# Patient Record
Sex: Female | Born: 1990 | Race: White | Hispanic: No | Marital: Single | Smoking: Never smoker
Health system: Southern US, Community
[De-identification: ages and names within clinical notes are randomized; demographics above are authoritative.]

## PROBLEM LIST (undated history)

## (undated) ENCOUNTER — Emergency Department (HOSPITAL_COMMUNITY): Admission: EM | Payer: Self-pay | Source: Home / Self Care

## (undated) DIAGNOSIS — N390 Urinary tract infection, site not specified: Secondary | ICD-10-CM

## (undated) DIAGNOSIS — E669 Obesity, unspecified: Secondary | ICD-10-CM

## (undated) DIAGNOSIS — I10 Essential (primary) hypertension: Secondary | ICD-10-CM

## (undated) DIAGNOSIS — T7840XA Allergy, unspecified, initial encounter: Secondary | ICD-10-CM

## (undated) HISTORY — DX: Urinary tract infection, site not specified: N39.0

## (undated) HISTORY — DX: Allergy, unspecified, initial encounter: T78.40XA

---

## 2014-12-08 ENCOUNTER — Encounter (HOSPITAL_COMMUNITY): Payer: Self-pay | Admitting: Adult Health

## 2014-12-08 ENCOUNTER — Emergency Department (HOSPITAL_COMMUNITY)
Admission: EM | Admit: 2014-12-08 | Discharge: 2014-12-08 | Disposition: A | Discharge status: Home / Self Care | Payer: Self-pay | Attending: Emergency Medicine | Admitting: Emergency Medicine

## 2014-12-08 DIAGNOSIS — N390 Urinary tract infection, site not specified: Secondary | ICD-10-CM | POA: Insufficient documentation

## 2014-12-08 DIAGNOSIS — I10 Essential (primary) hypertension: Secondary | ICD-10-CM | POA: Insufficient documentation

## 2014-12-08 HISTORY — DX: Essential (primary) hypertension: I10

## 2014-12-08 LAB — URINALYSIS, ROUTINE W REFLEX MICROSCOPIC
Bilirubin Urine: NEGATIVE
Glucose, UA: NEGATIVE mg/dL
KETONES UR: NEGATIVE mg/dL
Nitrite: NEGATIVE
PH: 6 (ref 5.0–8.0)
Protein, ur: NEGATIVE mg/dL
Specific Gravity, Urine: 1.017 (ref 1.005–1.030)
UROBILINOGEN UA: 0.2 mg/dL (ref 0.0–1.0)

## 2014-12-08 LAB — URINE MICROSCOPIC-ADD ON

## 2014-12-08 MED ORDER — ONDANSETRON HCL 4 MG PO TABS: 4.0000 mg | ORAL_TABLET | Freq: Four times a day (QID) | ORAL | Status: DC

## 2014-12-08 MED ORDER — CEPHALEXIN 250 MG PO CAPS
500.0000 mg | ORAL_CAPSULE | Freq: Once | ORAL | Status: AC
  Administered 2014-12-08: 500 mg via ORAL
  Filled 2014-12-08: qty 2

## 2014-12-08 MED ORDER — CIPROFLOXACIN HCL 500 MG PO TABS: 500.0000 mg | ORAL_TABLET | Freq: Two times a day (BID) | ORAL | Status: DC

## 2014-12-08 MED ORDER — TRAMADOL HCL 50 MG PO TABS: 50.0000 mg | ORAL_TABLET | Freq: Four times a day (QID) | ORAL | Status: DC | PRN

## 2014-12-08 MED ORDER — TRAMADOL HCL 50 MG PO TABS
100.0000 mg | ORAL_TABLET | Freq: Once | ORAL | Status: AC
  Administered 2014-12-08: 100 mg via ORAL
  Filled 2014-12-08: qty 2

## 2014-12-08 NOTE — ED Notes (Signed)
C/o painful urination, urgency and frequency and right flank pain-denies fever or chills. Began the 24th

## 2014-12-08 NOTE — Discharge Instructions (Signed)

## 2014-12-08 NOTE — ED Provider Notes (Signed)
CSN: [id number]     Arrival date & time 12/08/14  2052 History  This chart was scribed for Dorthula Matasiffany G Samiksha Pellicano, PA-C, working with Donnetta HutchingBrian Cook, MD by Elon SpannerGarrett Cook, ED Scribe. This patient was seen in room TR08C/TR08C and the patient's care was started at 9:50 PM.   Chief Complaint  Patient presents with  . Dysuria   The history is provided by the patient. No language interpreter was used.    HPI Comments: Kellie Foster is a 23 y.o. female who presents to the Emergency Department complaining of painful urination described as sharpness with associated increased frequency and mild lower back pain.  Patient reports a history of a UTI as a juvenile that was treated with Azo.  Patient denies nausea, vomiting, fevers, body aches, chills.  Patient is allergic to penicillin.     Past Medical History  Diagnosis Date  . Hypertension    History reviewed. No pertinent past surgical history. History reviewed. No pertinent family history. History  Substance Use Topics  . Smoking status: Never Smoker   . Smokeless tobacco: Not on file  . Alcohol Use: No   OB History    No data available     Review of Systems  Genitourinary: Positive for frequency.  Musculoskeletal: Positive for back pain.  All other systems reviewed and are negative.     Allergies  Review of patient's allergies indicates not on file.  Home Medications   Prior to Admission medications   Medication Sig Start Date End Date Taking? Authorizing Provider  ciprofloxacin (CIPRO) 500 MG tablet Take 1 tablet (500 mg total) by mouth 2 (two) times daily. 12/08/14   Haruto Demaria Irine SealG Valeri Sula, PA-C  ondansetron (ZOFRAN) 4 MG tablet Take 1 tablet (4 mg total) by mouth every 6 (six) hours. 12/08/14   Dorthula Matasiffany G Mccauley Diehl, PA-C  traMADol (ULTRAM) 50 MG tablet Take 1 tablet (50 mg total) by mouth every 6 (six) hours as needed. 12/08/14   Paiten Boies Irine SealG Catilyn Boggus, PA-C   BP 155/91 mmHg  Pulse 98  Temp(Src) 98.1 F (36.7 C) (Oral)  Resp 24  Ht 5\' 5"  (1.651 m)   Wt 258 lb (117.028 kg)  BMI 42.93 kg/m2  SpO2 100% Physical Exam  Constitutional: She is oriented to person, place, and time. She appears well-developed and well-nourished. No distress.  HENT:  Head: Normocephalic and atraumatic.  Eyes: Conjunctivae and EOM are normal.  Neck: Neck supple. No tracheal deviation present.  Cardiovascular: Normal rate.   Pulmonary/Chest: Effort normal. No respiratory distress.  Abdominal: There is tenderness in the suprapubic area. There is no rigidity, no rebound and no guarding.  Musculoskeletal: Normal range of motion.  Neurological: She is alert and oriented to person, place, and time.  Skin: Skin is warm and dry.  Psychiatric: She has a normal mood and affect. Her behavior is normal.  Nursing note and vitals reviewed.   ED Course  Procedures (including critical care time)  DIAGNOSTIC STUDIES: Oxygen Saturation is 100% on RA, normal by my interpretation.    COORDINATION OF CARE:  9:53 PM Patient will be prescribed antibiotics and pain medication.  Urine Culture sent out. Patient acknowledges and agrees with plan.     Labs Review Labs Reviewed  URINALYSIS, ROUTINE W REFLEX MICROSCOPIC - Abnormal; Notable for the following:    APPearance CLOUDY (*)    Hgb urine dipstick SMALL (*)    Leukocytes, UA LARGE (*)    All other components within normal limits  URINE MICROSCOPIC-ADD ON - Abnormal;  Notable for the following:    Squamous Epithelial / LPF FEW (*)    Bacteria, UA MANY (*)    All other components within normal limits  URINE CULTURE    Imaging Review No results found.   EKG Interpretation None      MDM   Final diagnoses:  UTI (lower urinary tract infection)   Rx: Cipro and Ultram  23 y.o.Kellie Foster's evaluation in the Emergency Department is complete. It has been determined that no acute conditions requiring further emergency intervention are present at this time. The patient/guardian have been advised of the diagnosis  and plan. We have discussed signs and symptoms that warrant return to the ED, such as changes or worsening in symptoms.  Vital signs are stable at discharge. Filed Vitals:   12/08/14 2105  BP: 155/91  Pulse: 98  Temp: 98.1 F (36.7 C)  Resp: 24    Patient/guardian has voiced understanding and agreed to follow-up with the PCP or specialist.  I personally performed the services described in this documentation, which was scribed in my presence. The recorded information has been reviewed and is accurate.    Dorthula Matasiffany G Aleathea Pugmire, PA-C 12/08/14 2204  Donnetta HutchingBrian Cook, MD 12/09/14 [phone]

## 2014-12-11 LAB — URINE CULTURE: Colony Count: >=100000

## 2014-12-12 ENCOUNTER — Telehealth (HOSPITAL_COMMUNITY): Payer: Self-pay | Admitting: *Deleted

## 2014-12-12 NOTE — Progress Notes (Signed)
ED Antimicrobial Stewardship Positive Culture Follow Up   Kellie Foster is an 24 y.o. female who presented to [facility] on 12/08/2014 with a chief complaint of  Chief Complaint  Patient presents with  . Dysuria    Recent Results (from the past 720 hour(s))  Urine culture     Status: None   Collection Time: 12/08/14  9:15 PM  Result Value Ref Range Status   Specimen Description URINE, RANDOM  Final   Special Requests ADDED [id number]  Final   Colony Count   Final    >=100,000 COLONIES/ML Performed at [facility]    Culture   Final    ESCHERICHIA COLI Note: Confirmed Extended Spectrum Beta-Lactamase Producer (ESBL) CRITICAL RESULT CALLED TO, READ BACK BY AND VERIFIED WITH: KIM ROBERTSON AT 9:28 P.M. ON 12/11/2014 WARBB Performed at [facility]    Report Status 12/11/2014 FINAL  Final   Organism ID, Bacteria ESCHERICHIA COLI  Final      Susceptibility   Escherichia coli - MIC*    AMPICILLIN >=32 RESISTANT Resistant     CEFAZOLIN >=64 RESISTANT Resistant     CEFTRIAXONE RESISTANT      CIPROFLOXACIN >=4 RESISTANT Resistant     GENTAMICIN <=1 SENSITIVE Sensitive     LEVOFLOXACIN >=8 RESISTANT Resistant     NITROFURANTOIN <=16 SENSITIVE Sensitive     TOBRAMYCIN <=1 SENSITIVE Sensitive     TRIMETH/SULFA >=320 RESISTANT Resistant     IMIPENEM <=0.25 SENSITIVE Sensitive     PIP/TAZO <=4 SENSITIVE Sensitive     * ESCHERICHIA COLI     Treated with ciprofloxacin, organism resistant to prescribed antimicrobial  Patient discharged originally without antimicrobial agent and treatment is now indicated  New antibiotic prescription:   1. Stop cipro 2. Start fosfomycin 3gm PO x 1  ED Provider: Emilia Beck  Marico Buckle, Drake Leach 12/12/2014, 12:39 PM Clinical Pharmacist Phone# [phone]

## 2014-12-13 ENCOUNTER — Telehealth: Payer: Self-pay | Admitting: Emergency Medicine

## 2014-12-15 ENCOUNTER — Telehealth (HOSPITAL_BASED_OUTPATIENT_CLINIC_OR_DEPARTMENT_OTHER): Payer: Self-pay | Admitting: *Deleted

## 2014-12-17 NOTE — Progress Notes (Signed)
Received call from patient regarding prescription for Fosfomycin that was called in after pos. Urine cultures for E- Coli ESBL. Patient states she is [facility] to [facility]  Patient is uninsured. Discussed MATCH program and guideline including $3 copay, patient agreeable. Enrolled in [facility]MATCH program and printed letter. At patient request MATCH [facility] [address]  [phone].  Faxed confirmation called.  Updated patient to contact [facility] when ready,she verbalized for understanding and appreciation for the assistance.

## 2014-12-18 ENCOUNTER — Other Ambulatory Visit: Payer: Self-pay | Admitting: *Deleted

## 2014-12-18 NOTE — Telephone Encounter (Signed)
Pt states she was prescribed a medication (monurol granules pack) that she could not fill due to it costing $300.  NCM noticed that pt received MATCH letter yesterday.  Called pharmacy to see if pt could still use that letter; pharmacy says ok.  [facility]

## 2015-01-29 ENCOUNTER — Emergency Department (HOSPITAL_COMMUNITY): Payer: Self-pay

## 2015-01-29 ENCOUNTER — Emergency Department (HOSPITAL_COMMUNITY)
Admission: EM | Admit: 2015-01-29 | Discharge: 2015-01-29 | Disposition: A | Discharge status: Home / Self Care | Payer: Self-pay | Attending: Emergency Medicine | Admitting: Emergency Medicine

## 2015-01-29 ENCOUNTER — Encounter (HOSPITAL_COMMUNITY): Payer: Self-pay | Admitting: Emergency Medicine

## 2015-01-29 DIAGNOSIS — Z880 Allergy status to penicillin: Secondary | ICD-10-CM | POA: Insufficient documentation

## 2015-01-29 DIAGNOSIS — I10 Essential (primary) hypertension: Secondary | ICD-10-CM | POA: Insufficient documentation

## 2015-01-29 DIAGNOSIS — R197 Diarrhea, unspecified: Secondary | ICD-10-CM | POA: Insufficient documentation

## 2015-01-29 DIAGNOSIS — R109 Unspecified abdominal pain: Secondary | ICD-10-CM

## 2015-01-29 DIAGNOSIS — M546 Pain in thoracic spine: Secondary | ICD-10-CM | POA: Insufficient documentation

## 2015-01-29 DIAGNOSIS — Z3202 Encounter for pregnancy test, result negative: Secondary | ICD-10-CM | POA: Insufficient documentation

## 2015-01-29 DIAGNOSIS — N201 Calculus of ureter: Secondary | ICD-10-CM | POA: Insufficient documentation

## 2015-01-29 DIAGNOSIS — R1110 Vomiting, unspecified: Secondary | ICD-10-CM

## 2015-01-29 DIAGNOSIS — Z79899 Other long term (current) drug therapy: Secondary | ICD-10-CM | POA: Insufficient documentation

## 2015-01-29 HISTORY — DX: Obesity, unspecified: E66.9

## 2015-01-29 LAB — CBC WITH DIFFERENTIAL/PLATELET
BASOS PCT: 0 % (ref 0–1)
Basophils Absolute: 0.1 10*3/uL (ref 0.0–0.1)
EOS PCT: 1 % (ref 0–5)
Eosinophils Absolute: 0.1 10*3/uL (ref 0.0–0.7)
HEMATOCRIT: 38.2 % (ref 36.0–46.0)
Hemoglobin: 12 g/dL (ref 12.0–15.0)
Lymphocytes Relative: 14 % (ref 12–46)
Lymphs Abs: 2 10*3/uL (ref 0.7–4.0)
MCH: 24.8 pg — AB (ref 26.0–34.0)
MCHC: 31.4 g/dL (ref 30.0–36.0)
MCV: 79.1 fL (ref 78.0–100.0)
MONO ABS: 1 10*3/uL (ref 0.1–1.0)
Monocytes Relative: 7 % (ref 3–12)
Neutro Abs: 11 10*3/uL — ABNORMAL HIGH (ref 1.7–7.7)
Neutrophils Relative %: 78 % — ABNORMAL HIGH (ref 43–77)
Platelets: 349 10*3/uL (ref 150–400)
RBC: 4.83 MIL/uL (ref 3.87–5.11)
RDW: 14.3 % (ref 11.5–15.5)
WBC: 14.2 10*3/uL — AB (ref 4.0–10.5)

## 2015-01-29 LAB — URINALYSIS, ROUTINE W REFLEX MICROSCOPIC
Glucose, UA: NEGATIVE mg/dL
Ketones, ur: 15 mg/dL — AB
NITRITE: NEGATIVE
Protein, ur: 100 mg/dL — AB
SPECIFIC GRAVITY, URINE: 1.031 — AB (ref 1.005–1.030)
Urobilinogen, UA: 1 mg/dL (ref 0.0–1.0)
pH: 5 (ref 5.0–8.0)

## 2015-01-29 LAB — COMPREHENSIVE METABOLIC PANEL
ALBUMIN: 3.5 g/dL (ref 3.5–5.2)
ALT: 23 U/L (ref 0–35)
ANION GAP: 6 (ref 5–15)
AST: 24 U/L (ref 0–37)
Alkaline Phosphatase: 60 U/L (ref 39–117)
BUN: 12 mg/dL (ref 6–23)
CHLORIDE: 108 mmol/L (ref 96–112)
CO2: 22 mmol/L (ref 19–32)
CREATININE: 0.71 mg/dL (ref 0.50–1.10)
Calcium: 8.8 mg/dL (ref 8.4–10.5)
GFR calc Af Amer: >90 mL/min (ref >90)
Glucose, Bld: 125 mg/dL — ABNORMAL HIGH (ref 70–99)
Potassium: 3.9 mmol/L (ref 3.5–5.1)
Sodium: 136 mmol/L (ref 135–145)
Total Bilirubin: 0.7 mg/dL (ref 0.3–1.2)
Total Protein: 7.4 g/dL (ref 6.0–8.3)

## 2015-01-29 LAB — POC URINE PREG, ED: Preg Test, Ur: NEGATIVE

## 2015-01-29 LAB — URINE MICROSCOPIC-ADD ON

## 2015-01-29 LAB — LIPASE, BLOOD: Lipase: 19 U/L (ref 11–59)

## 2015-01-29 MED ORDER — ONDANSETRON HCL 4 MG/2ML IJ SOLN
4.0000 mg | Freq: Once | INTRAMUSCULAR | Status: AC
  Administered 2015-01-29: 4 mg via INTRAVENOUS
  Filled 2015-01-29: qty 2

## 2015-01-29 MED ORDER — MORPHINE SULFATE 4 MG/ML IJ SOLN
4.0000 mg | Freq: Once | INTRAMUSCULAR | Status: AC
  Administered 2015-01-29: 4 mg via INTRAVENOUS
  Filled 2015-01-29: qty 1

## 2015-01-29 MED ORDER — NAPROXEN 500 MG PO TABS: 500.0000 mg | ORAL_TABLET | Freq: Two times a day (BID) | ORAL | Status: DC

## 2015-01-29 MED ORDER — ONDANSETRON HCL 4 MG PO TABS: 4.0000 mg | ORAL_TABLET | Freq: Four times a day (QID) | ORAL | Status: DC

## 2015-01-29 MED ORDER — KETOROLAC TROMETHAMINE 30 MG/ML IJ SOLN
30.0000 mg | Freq: Once | INTRAMUSCULAR | Status: AC
  Administered 2015-01-29: 30 mg via INTRAVENOUS
  Filled 2015-01-29: qty 1

## 2015-01-29 MED ORDER — OXYCODONE-ACETAMINOPHEN 5-325 MG PO TABS
1.0000 | ORAL_TABLET | Freq: Four times a day (QID) | ORAL | Status: DC | PRN
Start: 2015-01-29 — End: 2016-09-12

## 2015-01-29 MED ORDER — OXYCODONE-ACETAMINOPHEN 5-325 MG PO TABS: 1.0000 | ORAL_TABLET | Freq: Four times a day (QID) | ORAL | Status: DC | PRN

## 2015-01-29 MED ORDER — SODIUM CHLORIDE 0.9 % IV BOLUS (SEPSIS)
1000.0000 mL | Freq: Once | INTRAVENOUS | Status: AC
  Administered 2015-01-29: 1000 mL via INTRAVENOUS

## 2015-01-29 NOTE — ED Notes (Signed)
Patient transported to CT 

## 2015-01-29 NOTE — ED Notes (Signed)
[facility]

## 2015-01-29 NOTE — Discharge Instructions (Signed)

## 2015-01-29 NOTE — ED Notes (Signed)
Pt. reports  emesis , diarrhea and RLQ pain with chills, body aches and low grade fever onset today .

## 2015-01-29 NOTE — ED Provider Notes (Signed)
CSN: [id number]     Arrival date & time 01/29/15  1927 History   First MD Initiated Contact with Patient 01/29/15 2128     Chief Complaint  Patient presents with  . Emesis  . Diarrhea  . Abdominal Pain    (Consider location/radiation/quality/duration/timing/severity/associated sxs/prior Treatment) HPI Comments: 24 year old female with a history of hypertension and obesity presents to the emergency department for sudden onset of right sided abdominal pain. Patient also complaining of pain in her right mid back. Pain not relieved with ibuprofen. Patient reports associated nausea and 4 episodes of emesis. Patient states that pain is worse with certain movements and palpation to the area. She noticed hematuria when providing a urine sample in the ED. She reports a FHx of kidney stones; no personal hx of this.  Patient is a 24 y.o. female presenting with abdominal pain. The history is provided by the patient. No language interpreter was used.  Abdominal Pain Pain location: R sided. Pain quality: aching and sharp   Pain radiation: Radiates down to R groin. Pain severity:  Severe Onset quality:  Sudden Duration:  5 hours Timing:  Constant Progression:  Waxing and waning Chronicity:  New Context: awakening from sleep   Relieved by:  Nothing Worsened by:  Nothing tried Ineffective treatments:  NSAIDs Associated symptoms: hematuria, nausea and vomiting   Associated symptoms: no chest pain, no diarrhea, no dysuria, no fever, no hematemesis, no hematochezia, no melena, no shortness of breath, no vaginal bleeding and no vaginal discharge     Past Medical History  Diagnosis Date  . Hypertension   . Obesity    History reviewed. No pertinent past surgical history. No family history on file. History  Substance Use Topics  . Smoking status: Never Smoker   . Smokeless tobacco: Not on file  . Alcohol Use: No   OB History    No data available      Review of Systems  Constitutional:  Negative for fever.  Respiratory: Negative for shortness of breath.   Cardiovascular: Negative for chest pain.  Gastrointestinal: Positive for nausea, vomiting and abdominal pain. Negative for diarrhea, melena, hematochezia and hematemesis.  Genitourinary: Positive for hematuria and flank pain. Negative for dysuria, vaginal bleeding and vaginal discharge.  All other systems reviewed and are negative.   Allergies  Penicillins  Home Medications   Prior to Admission medications   Medication Sig Start Date End Date Taking? Authorizing Provider  metoprolol (LOPRESSOR) 50 MG tablet Take 50 mg by mouth 2 (two) times daily.   Yes Historical Provider, MD  ondansetron (ZOFRAN) 4 MG tablet Take 1 tablet (4 mg total) by mouth every 6 (six) hours. As needed for nausea/vomiting 01/29/15   Antony Madura, PA-C  oxyCODONE-acetaminophen (PERCOCET/ROXICET) 5-325 MG per tablet Take 1-2 tablets by mouth every 6 (six) hours as needed for moderate pain or severe pain. 01/29/15   Antony Madura, PA-C   BP 136/81 mmHg  Pulse 61  Temp(Src) 97.9 F (36.6 C) (Oral)  Resp 18  Ht  (1.676 m)  Wt 258 lb (117.028 kg)  BMI 41.66 kg/m2  SpO2 100%  LMP 01/07/2015   Physical Exam  Constitutional: She is oriented to person, place, and time. She appears well-developed and well-nourished. No distress.  Nontoxic/nonseptic appearing morbidly obese female  HENT:  Head: Normocephalic and atraumatic.  Eyes: Conjunctivae and EOM are normal. No scleral icterus.  Neck: Normal range of motion.  Cardiovascular: Normal rate, regular rhythm and normal heart sounds.   Pulmonary/Chest: Effort  normal and breath sounds normal. No respiratory distress. She has no wheezes. She has no rales.  Respirations even and unlabored  Abdominal: Soft. There is tenderness. There is no rebound and no guarding.  Tenderness to palpation in the right mid abdomen as well as the right flank. No masses or peritoneal signs. Abdomen soft and obese. Exam  is limited secondary to body habitus.  Musculoskeletal: Normal range of motion.  Neurological: She is alert and oriented to person, place, and time. She exhibits normal muscle tone. Coordination normal.  GCS 15. Patient moving extremities without ataxia.  Skin: Skin is warm and dry. No rash noted. She is not diaphoretic. No erythema. No pallor.  Psychiatric: She has a normal mood and affect. Her behavior is normal.  Nursing note and vitals reviewed.   ED Course  Procedures (including critical care time) Labs Review Labs Reviewed  CBC WITH DIFFERENTIAL/PLATELET - Abnormal; Notable for the following:    WBC 14.2 (*)    MCH 24.8 (*)    Neutrophils Relative % 78 (*)    Neutro Abs 11.0 (*)    All other components within normal limits  COMPREHENSIVE METABOLIC PANEL - Abnormal; Notable for the following:    Glucose, Bld 125 (*)    All other components within normal limits  URINALYSIS, ROUTINE W REFLEX MICROSCOPIC - Abnormal; Notable for the following:    Color, Urine AMBER (*)    APPearance CLOUDY (*)    Specific Gravity, Urine 1.031 (*)    Hgb urine dipstick LARGE (*)    Bilirubin Urine SMALL (*)    Ketones, ur 15 (*)    Protein, ur 100 (*)    Leukocytes, UA SMALL (*)    All other components within normal limits  URINE MICROSCOPIC-ADD ON - Abnormal; Notable for the following:    Squamous Epithelial / LPF FEW (*)    Bacteria, UA MANY (*)    All other components within normal limits  LIPASE, BLOOD  POC URINE PREG, ED    Imaging Review Ct Renal Stone Study  01/29/2015   CLINICAL DATA:  Right flank pain and hematuria with nausea and vomiting.  EXAM: CT ABDOMEN AND PELVIS WITHOUT CONTRAST  TECHNIQUE: Multidetector CT imaging of the abdomen and pelvis was performed following the standard protocol without IV contrast.  COMPARISON:  None.  FINDINGS: The lung bases are clear. Heart size is normal. No pneumothorax or pleural fluid.  A 1-2 mm stone is seen in the proximal right ureter just  below the ureteropelvic junction. There is mild to moderate right hydronephrosis. No other renal or ureteral stones are identified. The urinary bladder is decompressed but otherwise unremarkable. Uterus and adnexa appear normal.  There is fatty infiltration of the liver without focal lesion. The gallbladder, adrenal glands and spleen are unremarkable. There is some fatty atrophy about the stress of is some fatty atrophy of the head of the pancreas. No focal pancreatic lesion is identified. A few colonic diverticula are noted but there is no evidence of diverticulitis. The colon otherwise appears normal. The stomach, small bowel and appendix are unremarkable. There is no lymphadenopathy or fluid.  Bones demonstrate bilateral L5 pars interarticularis defects without anterolisthesis L5 on S1. No lytic or sclerotic bony lesion is identified.  IMPRESSION: Mild to moderate right hydronephrosis due to a 1-2 mm proximal right ureteral stone. No other urinary tract stones are identified.  Fatty infiltration of the liver.  Mild diverticulosis without diverticulitis.  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   Electronically Signed   By: Drusilla Kanner M.D.   On: 01/29/2015 22:56     EKG Interpretation None      MDM   Final diagnoses:  Flank pain  Vomiting  Ureterolithiasis    Pt has been diagnosed with a kidney stone via CT. There is no evidence of significant hydronephrosis, serum creatine WNL, vitals sign stable and the pt does not have irratractable vomiting. Pt will be discharged home with pain medications and has been advised to follow up with PCP. Urology referral and return precautions given. Patient agreeable to plan with no unaddressed concerns. Patient discharged in good condition.   Filed Vitals:   01/29/15 2231 01/29/15 2232 01/29/15 2300 01/29/15 2319  BP: 119/57  136/81   Pulse:  76 61   Temp:    97.9 F (36.6 C)  TempSrc:    Oral  Resp:       Height:      Weight:      SpO2:  100% 100%       Antony Madura, PA-C 01/29/15 2338  Flint Melter, MD 01/30/15 [phone]

## 2016-01-06 ENCOUNTER — Encounter (HOSPITAL_COMMUNITY): Payer: Self-pay | Admitting: Emergency Medicine

## 2016-01-06 ENCOUNTER — Emergency Department (HOSPITAL_COMMUNITY)
Admission: EM | Admit: 2016-01-06 | Discharge: 2016-01-06 | Disposition: A | Discharge status: Home / Self Care | Payer: Self-pay | Attending: Emergency Medicine | Admitting: Emergency Medicine

## 2016-01-06 DIAGNOSIS — M545 Low back pain: Secondary | ICD-10-CM | POA: Insufficient documentation

## 2016-01-06 DIAGNOSIS — R300 Dysuria: Secondary | ICD-10-CM | POA: Insufficient documentation

## 2016-01-06 DIAGNOSIS — Z79899 Other long term (current) drug therapy: Secondary | ICD-10-CM | POA: Insufficient documentation

## 2016-01-06 DIAGNOSIS — Z3202 Encounter for pregnancy test, result negative: Secondary | ICD-10-CM | POA: Insufficient documentation

## 2016-01-06 DIAGNOSIS — I10 Essential (primary) hypertension: Secondary | ICD-10-CM | POA: Insufficient documentation

## 2016-01-06 DIAGNOSIS — Z87442 Personal history of urinary calculi: Secondary | ICD-10-CM | POA: Insufficient documentation

## 2016-01-06 DIAGNOSIS — Z87440 Personal history of urinary (tract) infections: Secondary | ICD-10-CM | POA: Insufficient documentation

## 2016-01-06 DIAGNOSIS — R39198 Other difficulties with micturition: Secondary | ICD-10-CM | POA: Insufficient documentation

## 2016-01-06 DIAGNOSIS — R1030 Lower abdominal pain, unspecified: Secondary | ICD-10-CM | POA: Insufficient documentation

## 2016-01-06 DIAGNOSIS — Z880 Allergy status to penicillin: Secondary | ICD-10-CM | POA: Insufficient documentation

## 2016-01-06 LAB — URINALYSIS, ROUTINE W REFLEX MICROSCOPIC
Bilirubin Urine: NEGATIVE
Glucose, UA: NEGATIVE mg/dL
HGB URINE DIPSTICK: NEGATIVE
Ketones, ur: NEGATIVE mg/dL
Leukocytes, UA: NEGATIVE
Nitrite: NEGATIVE
Protein, ur: NEGATIVE mg/dL
SPECIFIC GRAVITY, URINE: 1.01 (ref 1.005–1.030)
pH: 6.5 (ref 5.0–8.0)

## 2016-01-06 LAB — BASIC METABOLIC PANEL
ANION GAP: 13 (ref 5–15)
BUN: 7 mg/dL (ref 6–20)
CO2: 22 mmol/L (ref 22–32)
Calcium: 9.4 mg/dL (ref 8.9–10.3)
Chloride: 103 mmol/L (ref 101–111)
Creatinine, Ser: 0.61 mg/dL (ref 0.44–1.00)
GFR calc non Af Amer: >60 mL/min (ref >60)
Glucose, Bld: 98 mg/dL (ref 65–99)
Potassium: 3.9 mmol/L (ref 3.5–5.1)
Sodium: 138 mmol/L (ref 135–145)

## 2016-01-06 LAB — I-STAT BETA HCG BLOOD, ED (MC, WL, AP ONLY)

## 2016-01-06 NOTE — ED Notes (Signed)
Pt from home for eval of back pain, states was dx with UTI and kidney infection and started on cipro. However pt states "my kidneys still hurt." denies any abd pain, vaginal discharge. States has had decrease in appetite and urine production.

## 2016-01-06 NOTE — ED Provider Notes (Signed)
CSN: [id number]     Arrival date & time 01/06/16  1439 History   First MD Initiated Contact with Patient 01/06/16 1744     Chief Complaint  Patient presents with  . Urinary Tract Infection     (Consider location/radiation/quality/duration/timing/severity/associated sxs/prior Treatment) HPI  25 year old obese female with history of hypertension presents complaining of flank pain, and dysuria.  Pt report for the past 5 days she has had burning on urination, difficulty voiding. Report bilateral low back pain and suprapubic pain instead of CVA tenderness.  Pain is sharp, stabbing.   Denies vaginal discharge.  Is sexually active but denies any pain with sex.  LMP 5 days ago.  Never been tested for STD.  No trauma or heavy lifting.  Pt was seen 3 days ago at PCP office for her complaints, and was prescribed Cipro as treatment for urinary tract infection based on the urine result.  She is 3 days into her treatment, but states the symptom has not fully resolved. She did notice that her burning urination and abdominal pain has improved. She did have remote history of kidney stones. She denies having fever, chills, nausea vomiting diarrhea, chest pain, shortness of breath, vaginal bleeding or vaginal discharge. She is sexually active with one partner who is in the room.   Past Medical History  Diagnosis Date  . Hypertension   . Obesity    History reviewed. No pertinent past surgical history. No family history on file. Social History  Substance Use Topics  . Smoking status: Never Smoker   . Smokeless tobacco: None  . Alcohol Use: No   OB History    No data available     Review of Systems  All other systems reviewed and are negative.     Allergies  Penicillins and Sulfa antibiotics  Home Medications   Prior to Admission medications   Medication Sig Start Date End Date Taking? Authorizing Provider  metoprolol (LOPRESSOR) 50 MG tablet Take 50 mg by mouth 2 (two) times daily.     Historical Provider, MD  ondansetron (ZOFRAN) 4 MG tablet Take 1 tablet (4 mg total) by mouth every 6 (six) hours. As needed for nausea/vomiting 01/29/15   Antony Madura, PA-C  oxyCODONE-acetaminophen (PERCOCET/ROXICET) 5-325 MG per tablet Take 1-2 tablets by mouth every 6 (six) hours as needed for moderate pain or severe pain. 01/29/15   Antony Madura, PA-C   BP 170/90 mmHg  Pulse 101  Temp(Src) 98.6 F (37 C)  Resp 20  SpO2 100% Physical Exam  Constitutional: She appears well-developed and well-nourished. No distress.  Morbidly obese Caucasian female laying in bed in no acute discomfort.  HENT:  Head: Atraumatic.  Eyes: Conjunctivae are normal.  Neck: Neck supple.  Cardiovascular: Normal rate and regular rhythm.   Pulmonary/Chest: Effort normal and breath sounds normal.  Abdominal: Soft. Bowel sounds are normal. She exhibits no distension. There is tenderness (mild suprapubic tenderness without guarding or rebound tenderness. No CVA tenderness.).  Musculoskeletal: She exhibits tenderness (Mild paralumbar spinal muscle tenderness on percussion bilaterally without CVA tenderness.).  Neurological: She is alert.  Skin: No rash noted.  Psychiatric: She has a normal mood and affect.  Nursing note and vitals reviewed.   ED Course  Procedures (including critical care time) Labs Review Labs Reviewed  URINALYSIS, ROUTINE W REFLEX MICROSCOPIC (NOT AT [facility])  BASIC METABOLIC PANEL  I-STAT BETA HCG BLOOD, ED ([facility], [facility], [facility] ONLY)    Imaging Review No results found. I have personally reviewed and evaluated  these images and lab results as part of my medical decision-making.   EKG Interpretation None      MDM   Final diagnoses:  Dysuria    BP 143/76 mmHg  Pulse 81  Temp(Src) 98.6 F (37 C)  Resp 20  SpO2 97%   6:43 PM patient presents with improving symptoms of UTI although not fully resolved. She is currently on Cipro antibiotic. Her urine today shows no evidence of urinary tract  infection. Her renal function is normal. Her pregnancy test is negative. I did discuss other potential cause of low abdominal pain and low back pain such as STD and offer a pelvic examination however patient felt that she has low risk for STD and prefers to continue with antibiotic and follow-up with her PCP. At this time I have low suspicion for any acute emergent medical condition such as appendicitis, diverticulitis, ovarian torsion, tubo-ovarian abscess or ectopic pregnancy although my examination is limited due to patient refusal for pelvic examination. Return precaution discussed. Patient voices pain and agrees with plan. No suspicion for kidney stones since patient has bilateral back pain and no obvious flank pain.   Fayrene Helper, PA-C 01/06/16 1847  Linwood Dibbles, MD 01/07/16 [phone]

## 2016-01-06 NOTE — Discharge Instructions (Signed)
Please continue taking your antibiotic for the full duration as treatment for your urinary tract infection. Follow with Dr. for further evaluation. Return to the ED if your condition worsen or if you have any other concern.  Dysuria Dysuria is pain or discomfort while urinating. The pain or discomfort may be felt in the tube that carries urine out of the bladder (urethra) or in the surrounding tissue of the genitals. The pain may also be felt in the groin area, lower abdomen, and lower back. You may have to urinate frequently or have the sudden feeling that you have to urinate (urgency). Dysuria can affect both men and women, but is more common in women. Dysuria can be caused by many different things, including:  Urinary tract infection in women.  Infection of the kidney or bladder.  Kidney stones or bladder stones.  Certain sexually transmitted infections (STIs), such as chlamydia.  Dehydration.  Inflammation of the vagina.  Use of certain medicines.  Use of certain soaps or scented products that cause irritation. HOME CARE INSTRUCTIONS Watch your dysuria for any changes. The following actions may help to reduce any discomfort you are feeling:  Drink enough fluid to keep your urine clear or pale yellow.  Empty your bladder often. Avoid holding urine for long periods of time.  After a bowel movement or urination, women should cleanse from front to back, using each tissue only once.  Empty your bladder after sexual intercourse.  Take medicines only as directed by your health care provider.  If you were prescribed an antibiotic medicine, finish it all even if you start to feel better.  Avoid caffeine, tea, and alcohol. They can irritate the bladder and make dysuria worse. In men, alcohol may irritate the prostate.  Keep all follow-up visits as directed by your health care provider. This is important.  If you had any tests done to find the cause of dysuria, it is your  responsibility to obtain your test results. Ask the lab or department performing the test when and how you will get your results. Talk with your health care provider if you have any questions about your results. SEEK MEDICAL CARE IF:  You develop pain in your back or sides.  You have a fever.  You have nausea or vomiting.  You have blood in your urine.  You are not urinating as often as you usually do. SEEK IMMEDIATE MEDICAL CARE IF:  You pain is severe and not relieved with medicines.  You are unable to hold down any fluids.  You or someone else notices a change in your mental function.  You have a rapid heartbeat at rest.  You have shaking or chills.  You feel extremely weak.   This information is not intended to replace advice given to you by your health care provider. Make sure you discuss any questions you have with your health care provider.   Document Released: 08/26/2004 Document Revised: 12/19/2014 Document Reviewed: 07/24/2014 Elsevier Interactive Patient Education [employer]

## 2016-01-18 IMAGING — CT CT RENAL STONE PROTOCOL
2 of 4 series · 15 of 46 positions shown, 17 images · non-contrast
Comparison: None.

CLINICAL DATA: Right flank pain and hematuria with nausea and
vomiting.

EXAM:
CT ABDOMEN AND PELVIS WITHOUT CONTRAST
TECHNIQUE: Multidetector CT imaging of the abdomen and pelvis was performed
following the standard protocol without IV contrast.

[Series 2: stone study 5.0 i30f 1 · axial · 0.98mm/px · z∈[-502,-2]mm · 12 of 110 slices shown, 14 images]
[im 5/110  soft-tissue]
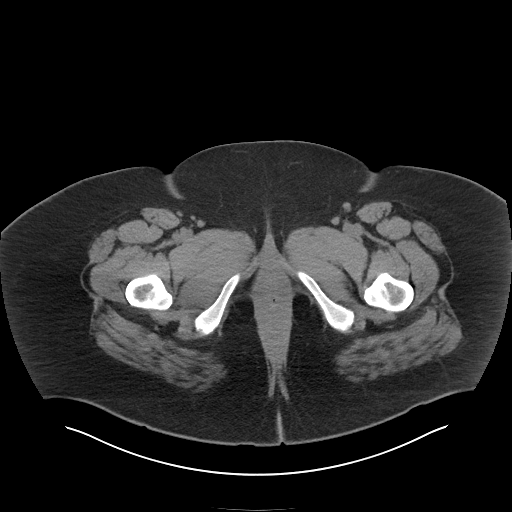
[im 5/110  bone]
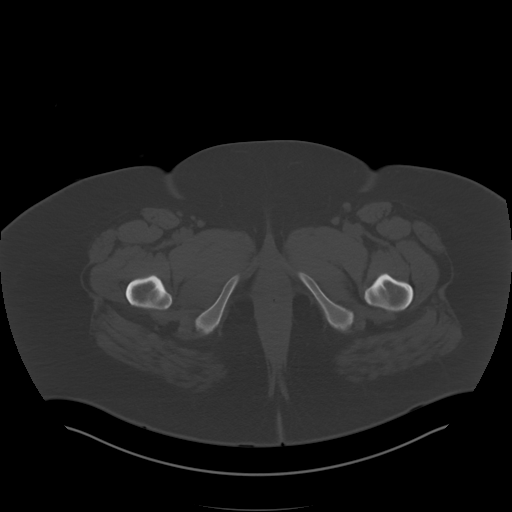
[im 15/110  soft-tissue]
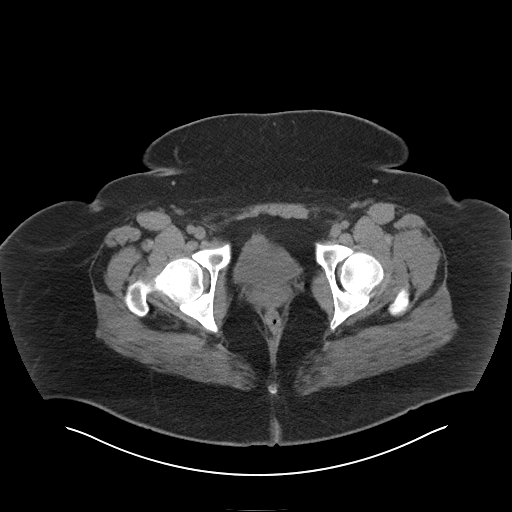
[im 25/110  soft-tissue]
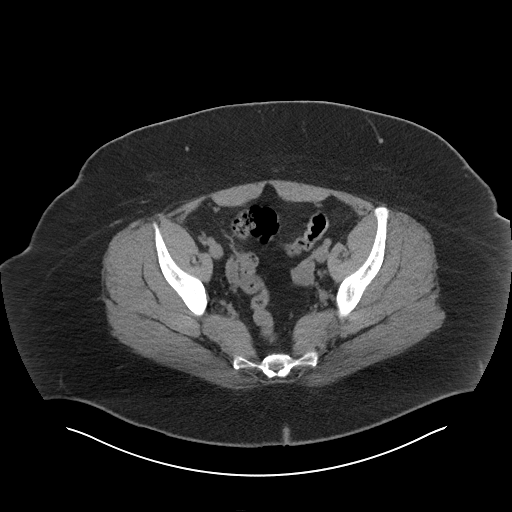
[im 35/110  soft-tissue]
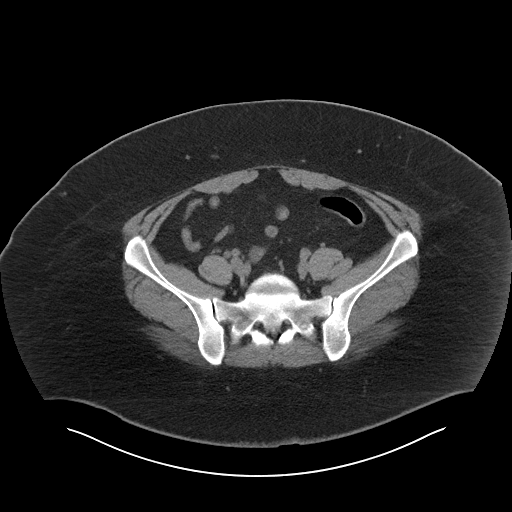
[im 40/110  soft-tissue]
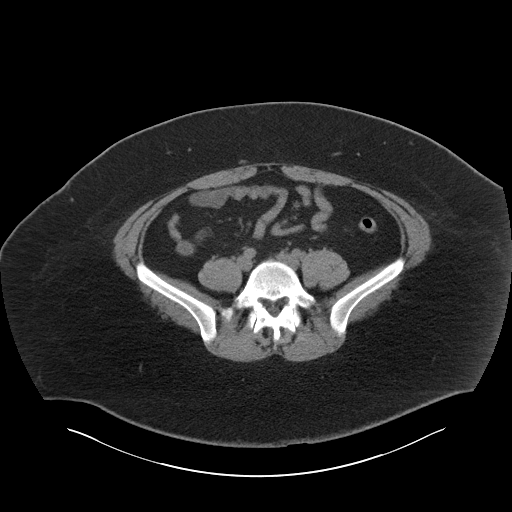
[im 50/110  soft-tissue]
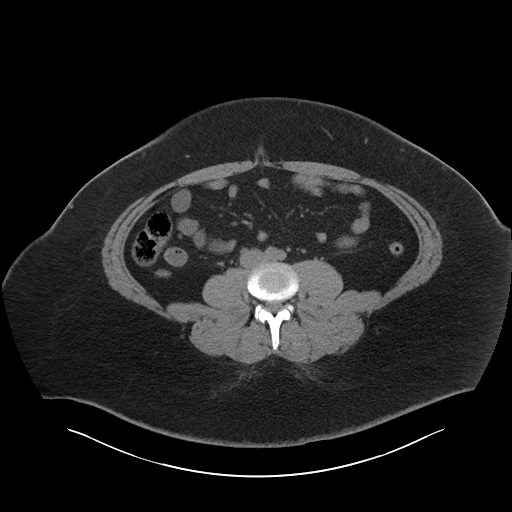
[im 60/110  soft-tissue]
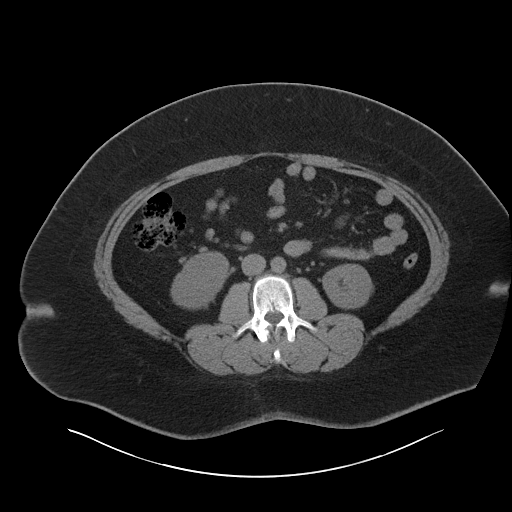
[im 70/110  soft-tissue]
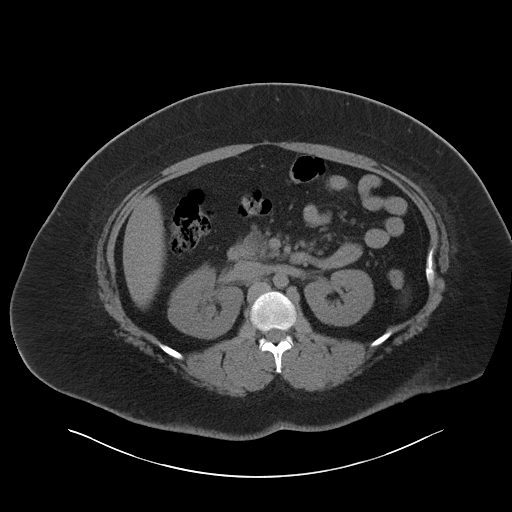
[im 75/110  soft-tissue]
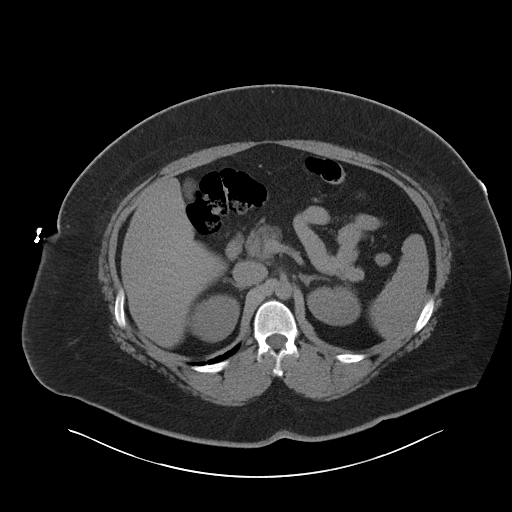
[im 75/110  bone]
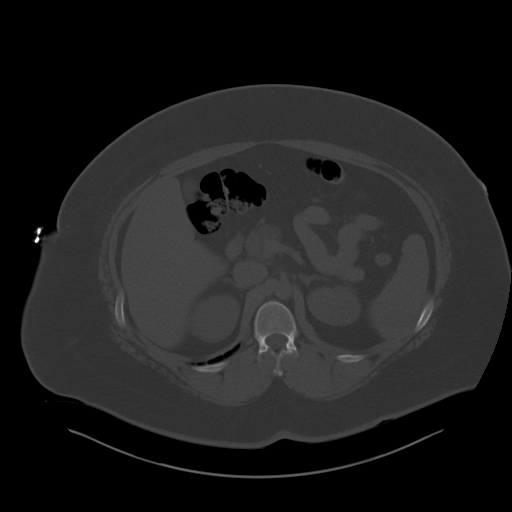
[im 85/110  soft-tissue]
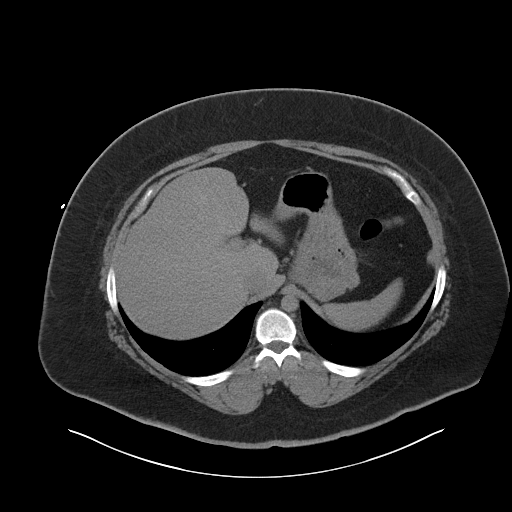
[im 95/110  soft-tissue]
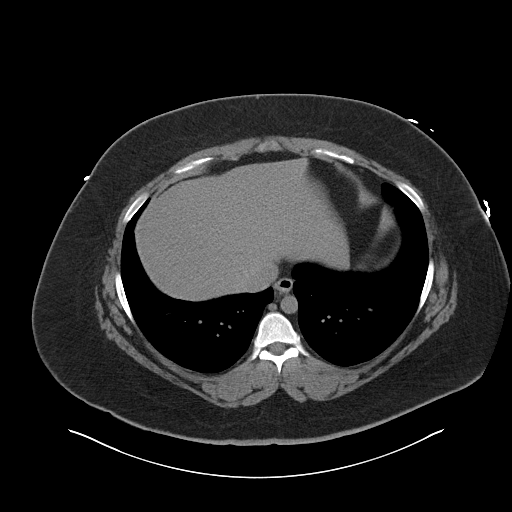
[im 105/110  soft-tissue]
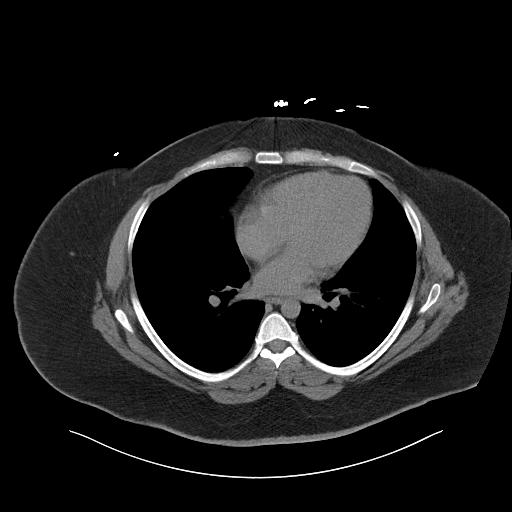

[Series 5: coronal soft tissue · coronal · 1.07mm/px · 3 of 123 slices shown]
[im 41/123  soft-tissue]
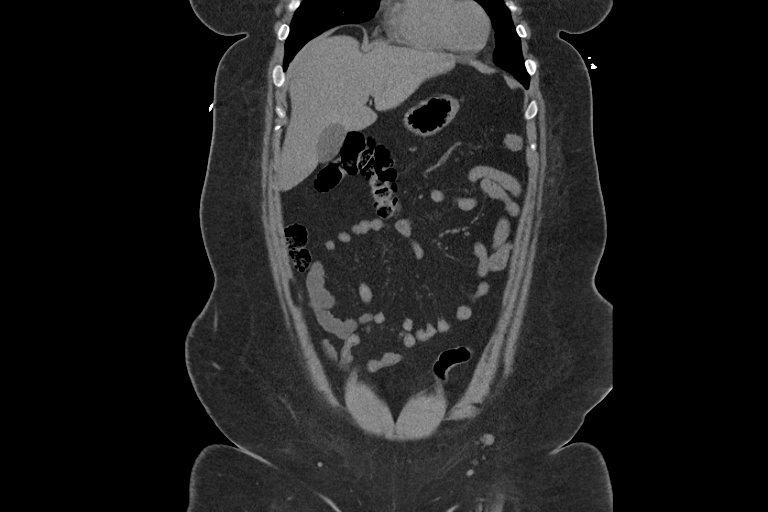
[im 55/123  soft-tissue]
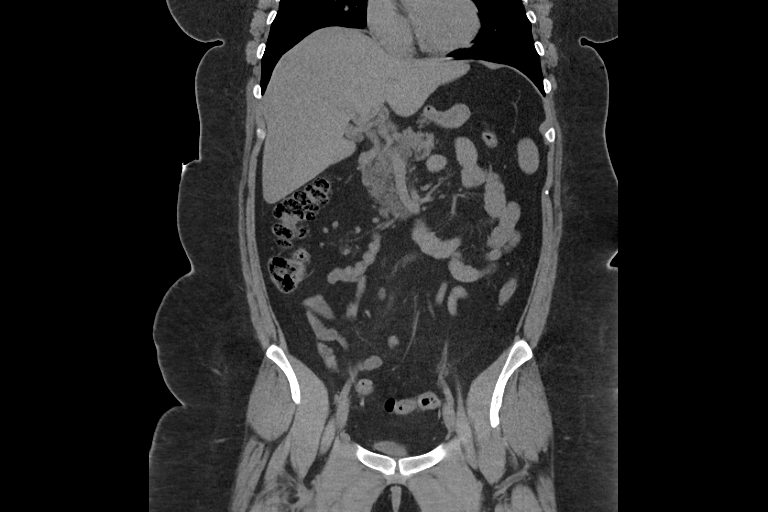
[im 68/123  soft-tissue]
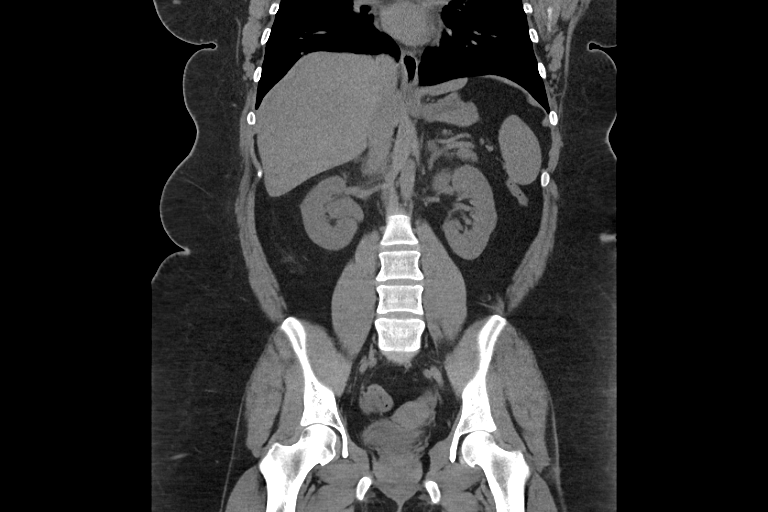

[15 of 46 positions shown; findings below may reference images not displayed]

FINDINGS: The lung bases are clear. Heart size is normal. No pneumothorax or
pleural fluid.

A 1-2 mm stone is seen in the proximal right ureter just below the
ureteropelvic junction. There is mild to moderate right
hydronephrosis. No other renal or ureteral stones are identified.
The urinary bladder is decompressed but otherwise unremarkable.
Uterus and adnexa appear normal.

There is fatty infiltration of the liver without focal lesion. The
gallbladder, adrenal glands and spleen are unremarkable. There is
some fatty atrophy about the stress of is some fatty atrophy of the
head of the pancreas. No focal pancreatic lesion is identified. A
few colonic diverticula are noted but there is no evidence of
diverticulitis. The colon otherwise appears normal. The stomach,
small bowel and appendix are unremarkable. There is no
lymphadenopathy or fluid.

Bones demonstrate bilateral L5 pars interarticularis defects without
anterolisthesis L5 on S1. No lytic or sclerotic bony lesion is
identified.
IMPRESSION: Mild to moderate right hydronephrosis due to a 1-2 mm proximal right
ureteral stone. No other urinary tract stones are identified.

Fatty infiltration of the liver.

Mild diverticulosis without diverticulitis.

Bilateral L5 pars interarticularis defects without anterolisthesis
L5 on S1.

## 2016-08-23 ENCOUNTER — Ambulatory Visit: Payer: Self-pay | Admitting: Family Medicine

## 2016-09-12 ENCOUNTER — Encounter: Payer: Self-pay | Admitting: Family Medicine

## 2016-09-12 ENCOUNTER — Ambulatory Visit (INDEPENDENT_AMBULATORY_CARE_PROVIDER_SITE_OTHER): Payer: Self-pay | Admitting: Family Medicine

## 2016-09-12 VITALS — BP 135/81 | HR 79 | Temp 98.3°F | Resp 16 | Ht 64.0 in | Wt 324.8 lb

## 2016-09-12 DIAGNOSIS — Z87898 Personal history of other specified conditions: Secondary | ICD-10-CM

## 2016-09-12 DIAGNOSIS — Z6841 Body Mass Index (BMI) 40.0 and over, adult: Secondary | ICD-10-CM | POA: Insufficient documentation

## 2016-09-12 DIAGNOSIS — Z23 Encounter for immunization: Secondary | ICD-10-CM

## 2016-09-12 DIAGNOSIS — Z7722 Contact with and (suspected) exposure to environmental tobacco smoke (acute) (chronic): Secondary | ICD-10-CM

## 2016-09-12 DIAGNOSIS — N390 Urinary tract infection, site not specified: Secondary | ICD-10-CM | POA: Insufficient documentation

## 2016-09-12 DIAGNOSIS — N920 Excessive and frequent menstruation with regular cycle: Secondary | ICD-10-CM

## 2016-09-12 DIAGNOSIS — R7309 Other abnormal glucose: Secondary | ICD-10-CM

## 2016-09-12 DIAGNOSIS — Z114 Encounter for screening for human immunodeficiency virus [HIV]: Secondary | ICD-10-CM

## 2016-09-12 DIAGNOSIS — Z131 Encounter for screening for diabetes mellitus: Secondary | ICD-10-CM

## 2016-09-12 DIAGNOSIS — I10 Essential (primary) hypertension: Secondary | ICD-10-CM

## 2016-09-12 MED ORDER — LISINOPRIL 20 MG PO TABS: 20.0000 mg | ORAL_TABLET | Freq: Every day | ORAL | 3 refills | Status: DC

## 2016-09-12 MED ORDER — HYDROCHLOROTHIAZIDE 25 MG PO TABS: 25.0000 mg | ORAL_TABLET | Freq: Every day | ORAL | 3 refills | Status: DC

## 2016-09-12 NOTE — Assessment & Plan Note (Addendum)
Chronic problem, well controlled  Plan: 1. Switch Lisinopril from 10mg  x 3 (30mg  daily) to 20mg  daily 2. DC metoprolol (given inc risk for metabolic effects and current HR stable, also no other cardiac indication without prior MI or arrhythmia) 3. Start HCTZ 25mg  daily - also help chronic peripheral edema likely related to weight 4. Lifestyle recommendations, weight loss, avoid 2nd hand smoke 5. Follow-up 6 weeks BP

## 2016-09-12 NOTE — Assessment & Plan Note (Signed)
Stable controlled on OCPs Follow-up as needed for refills

## 2016-09-12 NOTE — Progress Notes (Signed)
Subjective:    Patient ID: Kellie Foster, female    DOB: [birth year], 25 y.o.   MRN: [id number]  Kellie Foster is a 25 y.o. female presenting on 09/12/2016 for Establish Care  Previously established with PCP in [place].  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 Currently asymptomatic and doing well. She states her prior PCP was considering giving her rx antibiotics to take when needed, but never did this. Previously has taken Keflex for UTI. See listed allergies (PCN, sulfa, Cipro). Her typical UTI symptoms are low back pain, dysuria, and fever. Improved with keflex and zofran often. Patient's last menstrual period was 08/26/2016.  CHRONIC HTN: Reports needs BP med refills today. Does not check BP regularly but does have wrist cuff at home to check. Current Meds - Lisinopril 10mg  (taking 30mg  daily), Metoprolol 50mg  BID (states this was started due to low cost, but no other indication, no prior MI or arrhythmia or known heart disease) Reports good compliance, took meds today. Tolerating well, w/o complaints. Denies CP, dyspnea, HA, edema, dizziness / lightheadedness  MORBID OBESITY BMI >55 / Screening Cholesterol / History of Abnormal Glucose - Admits to chronic problem with weight gain, more recently over past 1 year when started OCPs has had significant weight gain, unable to lose significant amount. No medical treatment or specific diet plan for weight loss. - Her husband is diabetic, and she has occasionally checked her own fastin sugars in AM around 70-90s (never >100), otherwise no known history of elevated sugar, prior A1c but does not recall. 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 Hypertension   . Obesity   . Recurrent UTI (urinary tract infection)    Social History   Social History  . Marital status: Single    Spouse name: N/A  . Number of children: N/A  . Years of education: N/A   Occupational History  . Not on file.   Social History Main Topics  . Smoking status: Never Smoker  . Smokeless tobacco: Never Used     Comment: 2nd hand smoking exposure  . Alcohol use No  . Drug use: No  . Sexual activity: Not on file   Other Topics Concern  . Not on file   Social History Narrative  . No narrative on file   Family History  Problem Relation Age of Onset  . Hypertension Mother   . Diabetes Mother   . Kidney cancer Mother   . Hypertension Father    No current outpatient prescriptions on file prior to  visit.   No current facility-administered medications on file prior to visit.     Review of Systems  Constitutional: Negative for activity change, appetite change, chills, diaphoresis, fatigue, fever and unexpected weight change.  HENT: Negative for congestion, hearing loss and sinus pressure.   Eyes: Negative for visual disturbance.  Respiratory: Negative for cough, chest tightness, shortness of breath and wheezing.   Cardiovascular: Negative for chest pain, palpitations and leg swelling.  Gastrointestinal: Negative for abdominal  distention, abdominal pain, blood in stool, constipation, diarrhea, nausea and vomiting.  Endocrine: Negative for cold intolerance, polydipsia and polyuria.  Genitourinary: Negative for difficulty urinating, dysuria, flank pain, frequency, hematuria, menstrual problem and urgency.  Musculoskeletal: Negative for arthralgias, back pain, joint swelling and neck pain.  Skin: Negative for rash.  Allergic/Immunologic: Negative for environmental allergies.  Neurological: Negative for dizziness, weakness, light-headedness, numbness and headaches.  Hematological: Negative for adenopathy.  Psychiatric/Behavioral: Negative for behavioral problems, confusion, decreased concentration, dysphoric mood and sleep disturbance. The patient is not nervous/anxious.    Per HPI unless specifically indicated above     Objective:    BP 135/81   Pulse 79   Temp 98.3 F (36.8 C) (Oral)   Resp 16   Ht 5\' 4"  (1.626 m)   Wt (!) 324 lb 12.8 oz (147.3 kg)   LMP 08/26/2016   BMI 55.75 kg/m   Wt Readings from Last 3 Encounters:  09/12/16 (!) 324 lb 12.8 oz (147.3 kg)  01/29/15 258 lb (117 kg)  12/08/14 258 lb (117 kg)    Physical Exam  Constitutional: She is oriented to person, place, and time. She appears well-developed and well-nourished. No distress.  Obese, Well-appearing, comfortable, cooperative   HENT:  Head: Normocephalic and atraumatic.  Mouth/Throat: Oropharynx is clear and moist.  Eyes: Conjunctivae and EOM are normal. Pupils are equal, round, and reactive to light.  Neck: Normal range of motion. Neck supple. No thyromegaly present.  Cardiovascular: Normal rate, regular rhythm, normal heart sounds and intact distal pulses.   No murmur heard. Pulmonary/Chest: Effort normal and breath sounds normal. No respiratory distress. She has no wheezes. She has no rales.  Abdominal: Soft. Bowel sounds are normal. She exhibits no distension and no mass. There is no tenderness.  Musculoskeletal: Normal range  of motion. She exhibits no edema or tenderness.  Back normal without deformity or abnormal curvature.  Upper / Lower Extremities: - Normal muscle tone, strength bilateral upper extremities 5/5, lower extremities 5/5 - Bilateral shoulders, knees, wrist, ankles without deformity, tenderness, effusion - Normal Gait  Lymphadenopathy:    She has no cervical adenopathy.  Neurological: She is alert and oriented to person, place, and time.  Distal sensation intact to light touch  Skin: Skin is warm and dry. No rash noted. She is not diaphoretic.  Psychiatric: She has a normal mood and affect. Her behavior is normal. Thought content normal.  Nursing note and vitals reviewed.  I have personally reviewed the following lab results from 01/06/16.  Results for orders placed or performed during the hospital encounter of 01/06/16  Urinalysis, Routine w reflex microscopic-may I&O cath if menses (not at [facility])  Result Value Ref Range   Color, Urine YELLOW YELLOW   APPearance CLEAR CLEAR   Specific Gravity, Urine 1.010 1.005 - 1.030   pH 6.5 5.0 - 8.0   Glucose, UA NEGATIVE NEGATIVE mg/dL   Hgb urine dipstick NEGATIVE NEGATIVE   Bilirubin Urine NEGATIVE NEGATIVE   Ketones, ur NEGATIVE NEGATIVE mg/dL   Protein,  ur NEGATIVE NEGATIVE mg/dL   Nitrite NEGATIVE NEGATIVE   Leukocytes, UA NEGATIVE NEGATIVE  Basic metabolic panel  Result Value Ref Range   Sodium 138 135 - 145 mmol/L   Potassium 3.9 3.5 - 5.1 mmol/L   Chloride 103 101 - 111 mmol/L   CO2 22 22 - 32 mmol/L   Glucose, Bld 98 65 - 99 mg/dL   BUN 7 6 - 20 mg/dL   Creatinine, Ser 1.610.61 0.44 - 1.00 mg/dL   Calcium 9.4 8.9 - 09.610.3 mg/dL   GFR calc non Af Amer >60 >60 mL/min   GFR calc Af Amer >60 >60 mL/min   Anion gap 13 5 - 15  I-Stat beta hCG blood, ED ([facility], [facility], [facility] only)  Result Value Ref Range   I-stat hCG, quantitative <5.0 <5 mIU/mL   Comment 3              Assessment & Plan:   Problem List Items Addressed This Visit    Second hand  smoke exposure    Never smoker, advised her to continue to discuss smoking cessation with family members.      Recurrent UTI (urinary tract infection)    Currently asymptomatic without UTI. Chronic problem worsening >2 years, seems related to menstrual cycle, 3-5 UTi a year. No significant complicated UTIs or pyelo recently.  Plan: 1. Defer antibiotic prophylaxis (especially given very limited options with multiple antibiotic allergies) 2. [facility] for now to establish and recommendations (may need further work-up prior to starting prophylaxis, and consider which antibiotic would be best option, would consider Macrobid given no known allergy, but will await further recommendations) 3. If develops UTI in meantime, would consider Keflex course      Relevant Orders   Ambulatory referral to Urology   Morbid obesity with BMI of 50.0-59.9, adult (HCC) - Primary    Concern with significant weight gain over past 1-2 years, possibly related in part to OCP, however multifactorial, currently limited diet / exercise regimen, limited by work with getting home late. - Check fasting lipid today, A1c for DM screening, TSH (prior normal, but no records) - Counseling briefly on lifestyle modifications, low carb / smaller portions, continue drinking mostly water - Future follow-up with [facility] if difficulty losing weight, consider wt loss medications / referral if needed      Relevant Orders   Lipid panel   Hemoglobin A1c   TSH   Menorrhagia with regular cycle    Stable controlled on OCPs Follow-up as needed for refills      Hypertension    Chronic problem, well controlled  Plan: 1. Switch Lisinopril from 10mg  x 3 (30mg  daily) to 20mg  daily 2. DC metoprolol (given inc risk for metabolic effects and current HR stable, also no other cardiac indication without prior MI or arrhythmia) 3. Start HCTZ 25mg  daily - also help chronic peripheral edema likely related  to weight 4. Lifestyle recommendations, weight loss, avoid 2nd hand smoke 5. Follow-up 6 weeks BP      Relevant Medications   lisinopril (PRINIVIL,ZESTRIL) 20 MG tablet   hydrochlorothiazide (HYDRODIURIL) 25 MG tablet   Other Relevant Orders   Lipid panel   Hemoglobin A1c   History of peripheral edema    Chronic problem, worse with inc weight gain. No evidence of pitting or other systemic concern. Trial on HCTZ for BP may improve edema      Relevant Medications   hydrochlorothiazide (HYDRODIURIL) 25 MG tablet  Other Visit Diagnoses    Abnormal glucose       Relevant Orders   Hemoglobin A1c   Screening for diabetes mellitus       Relevant Orders   Hemoglobin A1c   Needs flu shot       Relevant Orders   Flu Vaccine QUAD 36+ mos IM (Completed)   Need for diphtheria-tetanus-pertussis (Tdap) vaccine       Relevant Orders   Tdap vaccine greater than or equal to 7yo IM (Completed)   Screening for HIV (human immunodeficiency virus)          Meds ordered this encounter  Medications  . DISCONTD: lisinopril (PRINIVIL,ZESTRIL) 10 MG tablet    Sig: Take 30 mg by mouth daily.  Marland Kitchen lisinopril (PRINIVIL,ZESTRIL) 20 MG tablet    Sig: Take 1 tablet (20 mg total) by mouth daily.    Dispense:  90 tablet    Refill:  3  . hydrochlorothiazide (HYDRODIURIL) 25 MG tablet    Sig: Take 1 tablet (25 mg total) by mouth daily.    Dispense:  90 tablet    Refill:  3      Follow up plan: Return in about 6 weeks (around 10/24/2016) for blood pressure.  Saralyn Pilar, DO [facility] 09/12/2016, 5:59 PM

## 2016-09-12 NOTE — Patient Instructions (Signed)
Thank you for coming in to clinic today.  1. Blood work today with cholesterol panel, A1c diabetes screening, and TSH thyroid - will send results through MyChart  2. [facility]erral to [facility] - may call or follow-up with them over next few weeks, discuss antibiotic options for prophylaxis or preventing UTI, they may talk about tests to see if other problem causing this.  3. Have pharmacy send [place] refill request for the birth control to get exact one  4. For blood pressure - Start taking Lisinopril 20mg  daily (1 pill a day), you may finish your current pills, may need to inc to 40mg  daily in future - STOP metoprolol twice a day - STart Hydrochlorothiazide 25mg  daily - THis will help with fluid and edema as well. Also stay active and raise legs up when resting to help swelling (above heart level or toes above your nose)  5. Flu shot and TDap today - you may have sore arms for 24-48 hrs, and may have mild immune response to flu shot with low grade fever, this is normal.  Please schedule a follow-up appointment with Dr. Althea CharonKaramalegos in 6 weeks to follow-up Blood Pressure (write down readings few times a week for now, bring to next visit), then we can get you back for Annual Physical in 12/2016 for labs and we can discuss pap smear at that time  If you have any other questions or concerns, please feel free to call the clinic or send a message through MyChart. You may also schedule an earlier appointment if necessary.  Saralyn PilarAlexander Zaeem Kandel, DO [facility]

## 2016-09-12 NOTE — Assessment & Plan Note (Signed)
Never smoker, advised her to continue to discuss smoking cessation with family members.

## 2016-09-12 NOTE — Assessment & Plan Note (Addendum)
Concern with significant weight gain over past 1-2 years, possibly related in part to OCP, however multifactorial, currently limited diet / exercise regimen, limited by work with getting home late. - Check fasting lipid today, A1c for DM screening, TSH (prior normal, but no records) - Counseling briefly on lifestyle modifications, low carb / smaller portions, continue drinking mostly water - Future follow-up with [facility] if difficulty losing weight, consider wt loss medications / referral if needed

## 2016-09-12 NOTE — Assessment & Plan Note (Signed)
Currently asymptomatic without UTI. Chronic problem worsening >2 years, seems related to menstrual cycle, 3-5 UTi a year. No significant complicated UTIs or pyelo recently.  Plan: 1. Defer antibiotic prophylaxis (especially given very limited options with multiple antibiotic allergies) 2. Referral to [facility] for now to establish and recommendations (may need further work-up prior to starting prophylaxis, and consider which antibiotic would be best option, would consider Macrobid given no known allergy, but will await further recommendations) 3. If develops UTI in meantime, would consider Keflex course

## 2016-09-12 NOTE — Assessment & Plan Note (Signed)
Chronic problem, worse with inc weight gain. No evidence of pitting or other systemic concern. Trial on HCTZ for BP may improve edema

## 2016-10-11 ENCOUNTER — Other Ambulatory Visit: Payer: Self-pay

## 2016-12-30 ENCOUNTER — Encounter: Payer: Self-pay | Admitting: Family Medicine

## 2017-01-11 ENCOUNTER — Encounter: Payer: Self-pay | Admitting: Family Medicine

## 2020-11-10 LAB — HM PAP SMEAR
HM Pap smear: NEGATIVE
HM Pap smear: NEGATIVE

## 2022-01-06 ENCOUNTER — Ambulatory Visit: Payer: Self-pay | Admitting: Physician Assistant

## 2022-01-31 ENCOUNTER — Telehealth: Payer: Self-pay

## 2022-01-31 NOTE — Telephone Encounter (Signed)
I left a message for the patient to call the office back. I was calling to go over the registration for her new patient appointment, along with confirming the appointment. Waiting for the patient to return the call.

## 2022-01-31 NOTE — Telephone Encounter (Signed)
Patient called back. Appointment has been canceled and reschedule. Put was unable to come in due to a work emergency.

## 2022-02-01 ENCOUNTER — Ambulatory Visit: Payer: Self-pay | Admitting: Physician Assistant

## 2022-03-01 ENCOUNTER — Telehealth: Payer: Self-pay | Admitting: Physician Assistant

## 2022-03-01 NOTE — Telephone Encounter (Signed)
03-01-22 CALLED NEW PT TO CONFIRMED APPT FOR 10AM ARRIVING AT 9:30AM FOR CHECK IN. CONFIRM ALL INSURANCE AND DEMOGRAPHICS. I ALSO SENT A MYCHART DOCUMENTATION TO THE PT SINCE HER MYCHART WAS ALREADY ACTIVE. ?

## 2022-03-02 ENCOUNTER — Ambulatory Visit: Payer: BC Managed Care – PPO | Admitting: Physician Assistant

## 2022-03-02 ENCOUNTER — Other Ambulatory Visit: Payer: Self-pay

## 2022-03-02 ENCOUNTER — Encounter: Payer: Self-pay | Admitting: Physician Assistant

## 2022-03-02 VITALS — BP 118/72 | HR 80 | Temp 97.3°F | Resp 18 | Ht 65.0 in | Wt 316.0 lb

## 2022-03-02 DIAGNOSIS — M722 Plantar fascial fibromatosis: Secondary | ICD-10-CM | POA: Diagnosis not present

## 2022-03-02 DIAGNOSIS — I10 Essential (primary) hypertension: Secondary | ICD-10-CM

## 2022-03-02 MED ORDER — MELOXICAM 7.5 MG PO TABS: 7.5000 mg | ORAL_TABLET | Freq: Every day | ORAL | 1 refills | Status: DC

## 2022-03-02 NOTE — Progress Notes (Signed)
? ?New Patient Office Visit ? ?Subjective:  ?Patient ID: Kellie Foster, female    DOB: 11-03-1991  Age: 31 y.o. MRN: [id number] ? ?CC:  ?Chief Complaint  ?Patient presents with  ? New Patient (Initial Visit)  ? Foot Pain  ?  Right   ? ? ?HPI ?Kellie Foster presents for complaints of right foot pain for about 2 months - denies history of injury or trauma ?Pain at heel and also across bottom of foot ?Is wearing support in shoes ?Worse after long periods on feet (works as Data processing manager) ? ?Pt with history of hypertension for over 10 years - currently on hctz 25mg  qd and zestril 20mg  qd ? ?Past Medical History:  ?Diagnosis Date  ? Hypertension   ? Obesity   ? Recurrent UTI (urinary tract infection)   ? ? ?No past surgical history on file. ? ?Family History  ?Problem Relation Age of Onset  ? Hypertension Mother   ? Diabetes Mother   ? Kidney cancer Mother   ? Hypertension Father   ? ? ?Social History  ? ?Socioeconomic History  ? Marital status: Single  ?  Spouse name: Not on file  ? Number of children: Not on file  ? Years of education: Not on file  ? Highest education level: Not on file  ?Occupational History  ? Not on file  ?Tobacco Use  ? Smoking status: Never  ? Smokeless tobacco: Never  ? Tobacco comments:  ?  2nd hand smoking exposure  ?Substance and Sexual Activity  ? Alcohol use: No  ? Drug use: No  ? Sexual activity: Not on file  ?Other Topics Concern  ? Not on file  ?Social History Narrative  ? Not on file  ? ?Social Determinants of Health  ? ?Financial Resource Strain: Not on file  ?Food Insecurity: Not on file  ?Transportation Needs: Not on file  ?Physical Activity: Not on file  ?Stress: Not on file  ?Social Connections: Not on file  ?Intimate Partner Violence: Not on file  ? ? ? ?Current Outpatient Medications:  ?  fluticasone (FLONASE) 50 MCG/ACT nasal spray, Place into the nose., Disp: , Rfl:  ?  meloxicam (MOBIC) 7.5 MG tablet, Take 1 tablet (7.5 mg total) by mouth daily. For heel/foot pain, Disp: 30 tablet,  Rfl: 1 ?  metoprolol tartrate (LOPRESSOR) 50 MG tablet, Take 50 mg by mouth 2 (two) times daily., Disp: , Rfl:  ?  Norgestimate-Ethinyl Estradiol Triphasic (TRI-SPRINTEC) 0.18/0.215/0.25 MG-35 MCG tablet, Take 1 tablet by mouth daily., Disp: , Rfl:  ?  albuterol (VENTOLIN HFA) 108 (90 Base) MCG/ACT inhaler, Inhale into the lungs. (Patient not taking: Reported on 03/02/2022), Disp: , Rfl:  ? ? ?Allergies  ?Allergen Reactions  ? Ciprofloxacin Hcl Hives  ? Penicillins   ?  Has patient had a PCN reaction causing immediate rash, facial/tongue/throat swelling, SOB or lightheadedness with hypotension:YES ?Has patient had a PCN reaction causing severe rash involving mucus membranes or skin necrosis:No ?Has patient had a PCN reaction that required hospitalization NO ?Has patient had a PCN reaction occurring within the last 10 years: NO ?If all of the above answers are "NO", then may proceed with Cephalosporin use.  ? Sulfa Antibiotics Rash  ? ? ?ROS ?CONSTITUTIONAL: Negative for chills, fatigue, fever, unintentional weight gain and unintentional weight loss.  ? ?CARDIOVASCULAR: Negative for chest pain, dizziness, palpitations and pedal edema.  ?RESPIRATORY: Negative for recent cough and dyspnea.  ? ?MSK: see HPI ?INTEGUMENTARY: Negative for rash.  ? ? ?  ?  Objective:  ?  ?PHYSICAL EXAM:  ? ?VS: BP 118/72   Pulse 80   Temp (!) 97.3 ?F (36.3 ?C)   Resp 18   Ht 5\' 5"  (1.651 m)   Wt (!) 316 lb (143.3 kg)   BMI 52.59 kg/m?  ? ?GEN: Well nourished, well developed, in no acute distress  ?Cardiac: RRR; no murmurs, rubs, or gallops,no edema -  ?Respiratory:  normal respiratory rate and pattern with no distress - normal breath sounds with no rales, rhonchi, wheezes or rubs ?GI: normal bowel sounds, no masses or tenderness ? to right heel and middle of foot ?Skin: warm and dry, no rash  ? ? ?BP 118/72   Pulse 80   Temp (!) 97.3 ?F (36.3 ?C)   Resp 18   Ht 5\' 5"  (1.651 m)   Wt (!) 316 lb (143.3 kg)   BMI 52.59 kg/m?   ?Wt Readings from Last 3 Encounters:  ?03/02/22 (!) 316 lb (143.3 kg)  ?09/12/16 (!) 324 lb 12.8 oz (147.3 kg)  ?01/29/15 258 lb (117 kg)  ? ? ? ?Health Maintenance Due  ?Topic Date Due  ? PAP SMEAR-Modifier  Never done  ? ? ?There are no preventive care reminders to display for this patient. ? ?No results found for: TSH ?Lab Results  ?Component Value Date  ? WBC 14.2 (H) 01/29/2015  ? HGB 12.0 01/29/2015  ? HCT 38.2 01/29/2015  ? MCV 79.1 01/29/2015  ? PLT 349 01/29/2015  ? ?Lab Results  ?Component Value Date  ? NA 138 01/06/2016  ? K 3.9 01/06/2016  ? CO2 22 01/06/2016  ? GLUCOSE 98 01/06/2016  ? BUN 7 01/06/2016  ? CREATININE 0.61 01/06/2016  ? BILITOT 0.7 01/29/2015  ? ALKPHOS 60 01/29/2015  ? AST 24 01/29/2015  ? ALT 23 01/29/2015  ? PROT 7.4 01/29/2015  ? ALBUMIN 3.5 01/29/2015  ? CALCIUM 9.4 01/06/2016  ? ANIONGAP 13 01/06/2016  ? ?No results found for: CHOL ?No results found for: HDL ?No results found for: LDLCALC ?No results found for: TRIG ?No results found for: CHOLHDL ?No results found for: HGBA1C ? ?  ?Assessment & Plan:  ? ?Problem List Items Addressed This Visit   ? ?  ? Cardiovascular and Mediastinum  ? Hypertension  ? Relevant Medications  ? metoprolol tartrate (LOPRESSOR) 50 MG tablet  ? ?Other Visit Diagnoses   ? ? Plantar fasciitis of right foot    -  Primary  ? Relevant Medications  ? meloxicam (MOBIC) 7.5 MG tablet ?Ice therapy ?Rom exercises  ? ?  ? ? ?Meds ordered this encounter  ?Medications  ? meloxicam (MOBIC) 7.5 MG tablet  ?  Sig: Take 1 tablet (7.5 mg total) by mouth daily. For heel/foot pain  ?  Dispense:  30 tablet  ?  Refill:  1  ?  Order Specific Question:   Supervising Provider  ?  Answer01/27/2017 01/08/2016  ? ? ?Follow-up: Return in about 3 months (around 06/02/2022) for fasting physical.  ? ? ?SARA R Lashai Grosch, PA-C ?

## 2022-04-21 ENCOUNTER — Telehealth: Payer: Self-pay

## 2022-04-21 NOTE — Telephone Encounter (Signed)
Patient called stating she had a "major headache" on Monday which subsided, woke up today with a nose bleed which took approx 15 minutes to stop, then started having a pain in her right arm from her shoulder to elbow. Denied any dizziness, nausea, cx pain, sob. Advised patient not to take mobic but to take tylenol for her should pain, spoke with Kennon Rounds who agreed and also advised patient to alternate hot and cold compresses on her shoulder and should her symptoms worsen or persist to contact our office at the beginning of the week. Patient also aware that should she experience cx pain or sob to go to the ER. ?

## 2022-06-07 ENCOUNTER — Ambulatory Visit: Payer: BC Managed Care – PPO | Admitting: Physician Assistant

## 2022-06-24 ENCOUNTER — Encounter: Payer: Self-pay | Admitting: Physician Assistant

## 2022-06-24 ENCOUNTER — Ambulatory Visit: Payer: BC Managed Care – PPO | Admitting: Physician Assistant

## 2022-06-24 VITALS — BP 114/80 | HR 72 | Temp 97.4°F | Ht 65.0 in | Wt 328.2 lb

## 2022-06-24 DIAGNOSIS — M79671 Pain in right foot: Secondary | ICD-10-CM

## 2022-06-24 DIAGNOSIS — I10 Essential (primary) hypertension: Secondary | ICD-10-CM

## 2022-06-24 DIAGNOSIS — Z6841 Body Mass Index (BMI) 40.0 and over, adult: Secondary | ICD-10-CM

## 2022-06-24 MED ORDER — MELOXICAM 15 MG PO TABS: 15.0000 mg | ORAL_TABLET | Freq: Every day | ORAL | 1 refills | Status: DC

## 2022-06-24 MED ORDER — WEGOVY 0.25 MG/0.5ML ~~LOC~~ SOAJ: 0.2500 mg | SUBCUTANEOUS | 0 refills | Status: DC

## 2022-06-25 LAB — LIPID PANEL
Chol/HDL Ratio: 3.2 ratio (ref 0.0–4.4)
Cholesterol, Total: 162 mg/dL (ref 100–199)
HDL: 50 mg/dL (ref >39)
LDL Chol Calc (NIH): 99 mg/dL (ref 0–99)
Triglycerides: 68 mg/dL (ref 0–149)
VLDL Cholesterol Cal: 13 mg/dL (ref 5–40)

## 2022-06-25 LAB — CBC WITH DIFFERENTIAL/PLATELET
Basophils Absolute: 0.1 10*3/uL (ref 0.0–0.2)
Basos: 1 %
EOS (ABSOLUTE): 0.3 10*3/uL (ref 0.0–0.4)
Eos: 2 %
Hematocrit: 45.4 % (ref 34.0–46.6)
Hemoglobin: 14.8 g/dL (ref 11.1–15.9)
Immature Grans (Abs): 0 10*3/uL (ref 0.0–0.1)
Immature Granulocytes: 0 %
Lymphocytes Absolute: 3.5 10*3/uL — ABNORMAL HIGH (ref 0.7–3.1)
Lymphs: 23 %
MCH: 28 pg (ref 26.6–33.0)
MCHC: 32.6 g/dL (ref 31.5–35.7)
MCV: 86 fL (ref 79–97)
Monocytes Absolute: 1.3 10*3/uL — ABNORMAL HIGH (ref 0.1–0.9)
Monocytes: 8 %
Neutrophils Absolute: 10.1 10*3/uL — ABNORMAL HIGH (ref 1.4–7.0)
Neutrophils: 66 %
Platelets: 359 10*3/uL (ref 150–450)
RBC: 5.29 x10E6/uL — ABNORMAL HIGH (ref 3.77–5.28)
RDW: 13.2 % (ref 11.7–15.4)
WBC: 15.3 10*3/uL — ABNORMAL HIGH (ref 3.4–10.8)

## 2022-06-25 LAB — COMPREHENSIVE METABOLIC PANEL
ALT: 19 IU/L (ref 0–32)
AST: 17 IU/L (ref 0–40)
Albumin/Globulin Ratio: 1.2 (ref 1.2–2.2)
Albumin: 4.2 g/dL (ref 3.9–4.9)
Alkaline Phosphatase: 79 IU/L (ref 44–121)
BUN/Creatinine Ratio: 17 (ref 9–23)
BUN: 10 mg/dL (ref 6–20)
Bilirubin Total: 0.5 mg/dL (ref 0.0–1.2)
CO2: 22 mmol/L (ref 20–29)
Calcium: 9.8 mg/dL (ref 8.7–10.2)
Chloride: 99 mmol/L (ref 96–106)
Creatinine, Ser: 0.59 mg/dL (ref 0.57–1.00)
Globulin, Total: 3.6 g/dL (ref 1.5–4.5)
Glucose: 85 mg/dL (ref 70–99)
Potassium: 4.7 mmol/L (ref 3.5–5.2)
Sodium: 136 mmol/L (ref 134–144)
Total Protein: 7.8 g/dL (ref 6.0–8.5)
eGFR: 123 mL/min/{1.73_m2} (ref >59)

## 2022-06-25 LAB — HEMOGLOBIN A1C
Est. average glucose Bld gHb Est-mCnc: 105 mg/dL
Hgb A1c MFr Bld: 5.3 % (ref 4.8–5.6)

## 2022-06-25 LAB — CARDIOVASCULAR RISK ASSESSMENT

## 2022-06-25 LAB — TSH: TSH: 3.46 u[IU]/mL (ref 0.450–4.500)

## 2022-06-27 ENCOUNTER — Other Ambulatory Visit: Payer: Self-pay | Admitting: Physician Assistant

## 2022-06-27 ENCOUNTER — Other Ambulatory Visit: Payer: Self-pay

## 2022-06-27 DIAGNOSIS — R899 Unspecified abnormal finding in specimens from other organs, systems and tissues: Secondary | ICD-10-CM

## 2022-06-27 MED ORDER — METOPROLOL TARTRATE 50 MG PO TABS
50.0000 mg | ORAL_TABLET | Freq: Two times a day (BID) | ORAL | 0 refills | Status: DC
Start: 2022-06-27 — End: 2022-06-28

## 2022-06-28 ENCOUNTER — Encounter: Payer: Self-pay | Admitting: Physician Assistant

## 2022-06-28 ENCOUNTER — Other Ambulatory Visit: Payer: Self-pay | Admitting: Physician Assistant

## 2022-06-28 MED ORDER — METOPROLOL TARTRATE 50 MG PO TABS: 50.0000 mg | ORAL_TABLET | Freq: Two times a day (BID) | ORAL | 1 refills | Status: DC

## 2022-06-28 NOTE — Progress Notes (Signed)
Subjective:  Patient ID: Kellie Foster, female    DOB: 12/17/1990  Age: 31 y.o. MRN: [id number]  Chief Complaint  Patient presents with   Hypertension    HPI  Pt presents for follow up of hypertension. The patient is tolerating the medication well without side effects. Compliance with treatment has been good; including taking medication as directed , maintains a healthy diet and regular exercise regimen , and following up as directed. She is currently on metoprolol 50mg  bid  Pt with complaints of pain in her right heel - worse with first getting up - does use mobic 7.5mg  which helps sometimes  Pt would like to try wegovy for weight loss - she is trying to watch diet and states will start exercising/ becoming more active  Current Outpatient Medications on File Prior to Visit  Medication Sig Dispense Refill   albuterol (VENTOLIN HFA) 108 (90 Base) MCG/ACT inhaler Inhale into the lungs. (Patient not taking: Reported on 03/02/2022)     fluticasone (FLONASE) 50 MCG/ACT nasal spray Place into the nose.     Norgestimate-Ethinyl Estradiol Triphasic (TRI-SPRINTEC) 0.18/0.215/0.25 MG-35 MCG tablet Take 1 tablet by mouth daily.     No current facility-administered medications on file prior to visit.   Past Medical History:  Diagnosis Date   Hypertension    Obesity    Recurrent UTI (urinary tract infection)    History reviewed. No pertinent surgical history.  Family History  Problem Relation Age of Onset   Hypertension Mother    Diabetes Mother    Kidney cancer Mother    Hypertension Father    Social History   Socioeconomic History   Marital status: Single    Spouse name: Not on file   Number of children: Not on file   Years of education: Not on file   Highest education level: Not on file  Occupational History   Not on file  Tobacco Use   Smoking status: Never   Smokeless tobacco: Never   Tobacco comments:    2nd hand smoking exposure  Substance and Sexual Activity   Alcohol  use: No   Drug use: No   Sexual activity: Not on file  Other Topics Concern   Not on file  Social History Narrative   Not on file   Social Determinants of Health   Financial Resource Strain: Not on file  Food Insecurity: Not on file  Transportation Needs: Not on file  Physical Activity: Not on file  Stress: Not on file  Social Connections: Not on file    Review of Systems CONSTITUTIONAL: Negative for chills, fatigue, fever, unintentional weight gain and unintentional weight loss.   CARDIOVASCULAR: Negative for chest pain, dizziness, palpitations and pedal edema.  RESPIRATORY: Negative for recent cough and dyspnea.  GASTROINTESTINAL: Negative for abdominal pain, acid reflux symptoms, constipation, diarrhea, nausea and vomiting.  MSK:see HPI INTEGUMENTARY: Negative for rash.        Objective:  PHYSICAL EXAM:   VS: BP 114/80 (BP Location: Left Arm, Patient Position: Sitting, Cuff Size: Large)   Pulse 72   Temp (!) 97.4 F (36.3 C) (Temporal)   Ht 5\' 5"  (1.651 m)   Wt (!) 328 lb 3.2 oz (148.9 kg)   SpO2 97%   BMI 54.62 kg/m   GEN: Well nourished, well developed, in no acute distress   Cardiac: RRR; no murmurs,  Respiratory:  normal respiratory rate and pattern with no distress - normal breath sounds with no rales, rhonchi, wheezes or rubs  MS: no deformity or atrophy  Skin: warm and dry, no rash     Lab Results  Component Value Date   WBC 15.3 (H) 06/24/2022   HGB 14.8 06/24/2022   HCT 45.4 06/24/2022   PLT 359 06/24/2022   GLUCOSE 85 06/24/2022   CHOL 162 06/24/2022   TRIG 68 06/24/2022   HDL 50 06/24/2022   LDLCALC 99 06/24/2022   ALT 19 06/24/2022   AST 17 06/24/2022   NA 136 06/24/2022   K 4.7 06/24/2022   CL 99 06/24/2022   CREATININE 0.59 06/24/2022   BUN 10 06/24/2022   CO2 22 06/24/2022   TSH 3.460 06/24/2022   HGBA1C 5.3 06/24/2022      Assessment & Plan:   Problem List Items Addressed This Visit       Cardiovascular and  Mediastinum   Hypertension - Primary   Relevant Orders   CBC with Differential/Platelet (Completed)   Comprehensive metabolic panel (Completed)   TSH (Completed)   Lipid panel (Completed)   Hemoglobin A1c (Completed)     Other   Morbid obesity with BMI of 50.0-59.9, adult (HCC)   Relevant Medications   Semaglutide-Weight Management (WEGOVY) 0.25 MG/0.5ML SOAJ   Other Relevant Orders   TSH (Completed)   Hemoglobin A1c (Completed)   Other Visit Diagnoses     Pain of right heel       Relevant Medications   meloxicam (MOBIC) 15 MG tablet     .  Meds ordered this encounter  Medications   meloxicam (MOBIC) 15 MG tablet    Sig: Take 1 tablet (15 mg total) by mouth daily.    Dispense:  90 tablet    Refill:  1    Order Specific Question:   Supervising Provider    Answer:   Corey Harold   Semaglutide-Weight Management (WEGOVY) 0.25 MG/0.5ML SOAJ    Sig: Inject 0.25 mg into the skin once a week.    Dispense:  2 mL    Refill:  0    Order Specific Question:   Supervising Provider    AnswerCorey Harold    Orders Placed This Encounter  Procedures   CBC with Differential/Platelet   Comprehensive metabolic panel   TSH   Lipid panel   Hemoglobin A1c   HM PAP SMEAR   HM PAP SMEAR   Cardiovascular Risk Assessment     Follow-up: Return in about 3 months (around 09/24/2022) for chronic fasting follow up.  An After Visit Summary was printed and given to the patient.  Jettie Pagan [facility] [phone]

## 2022-07-01 ENCOUNTER — Other Ambulatory Visit: Payer: BC Managed Care – PPO

## 2022-07-01 DIAGNOSIS — R899 Unspecified abnormal finding in specimens from other organs, systems and tissues: Secondary | ICD-10-CM

## 2022-07-01 LAB — CBC WITH DIFFERENTIAL/PLATELET
Basophils Absolute: 0.1 10*3/uL (ref 0.0–0.2)
Basos: 1 %
EOS (ABSOLUTE): 0.2 10*3/uL (ref 0.0–0.4)
Eos: 2 %
Hematocrit: 45.2 % (ref 34.0–46.6)
Hemoglobin: 14.7 g/dL (ref 11.1–15.9)
Immature Grans (Abs): 0 10*3/uL (ref 0.0–0.1)
Immature Granulocytes: 0 %
Lymphocytes Absolute: 2.3 10*3/uL (ref 0.7–3.1)
Lymphs: 22 %
MCH: 27.8 pg (ref 26.6–33.0)
MCHC: 32.5 g/dL (ref 31.5–35.7)
MCV: 86 fL (ref 79–97)
Monocytes Absolute: 0.9 10*3/uL (ref 0.1–0.9)
Monocytes: 9 %
Neutrophils Absolute: 6.7 10*3/uL (ref 1.4–7.0)
Neutrophils: 66 %
Platelets: 362 10*3/uL (ref 150–450)
RBC: 5.28 x10E6/uL (ref 3.77–5.28)
RDW: 12.9 % (ref 11.7–15.4)
WBC: 10.2 10*3/uL (ref 3.4–10.8)

## 2022-07-21 ENCOUNTER — Other Ambulatory Visit: Payer: Self-pay | Admitting: Physician Assistant

## 2022-07-25 ENCOUNTER — Other Ambulatory Visit: Payer: Self-pay

## 2022-07-25 ENCOUNTER — Telehealth: Payer: Self-pay

## 2022-07-25 NOTE — Telephone Encounter (Signed)
Patient will need refill, she is going to 0.5 mg of wegovy. She stated she has called every where and is not able to find it. [facility] told her they will have it maybe at the beginning of oct. Please advise.

## 2022-07-25 NOTE — Telephone Encounter (Signed)
Patient will need refill, she is going to 0.5 mg of wegovy. She stated she has called every where and is not able to find it. [facility] told her they will have it maybe at the beginning of oct. Please advise. 

## 2022-07-25 NOTE — Telephone Encounter (Signed)
She will have to contact other pharmacies to see where it is available --- is on backorder for most places Otherwise will have to restart therapy when available or will have to consider other medications for weight loss - have her contact her insurance to see what is covered [facility] changed the rx of Wegovy I sent in to ozempic without permission from our office -- that medication is for diabetics and prediabetics only and not indicated for weight loss --

## 2022-07-26 NOTE — Telephone Encounter (Signed)
Patient made aware, will call insurance to see what medication they will cover until wevogy is able to order.

## 2022-08-01 ENCOUNTER — Telehealth: Payer: Self-pay

## 2022-08-01 NOTE — Telephone Encounter (Signed)
Patient was informed.

## 2022-08-01 NOTE — Telephone Encounter (Signed)
Patient mentioned that she still have maybe 3 more shots of ozempic and she would like to know if can she still using it until she receive Wegovy. Please advice.

## 2022-10-27 ENCOUNTER — Telehealth: Payer: Self-pay

## 2022-10-27 NOTE — Telephone Encounter (Signed)
Patient called this afternoon stating that she has a painful cyst under her breast that has not came to a head yet. I spoke with Dr. Sedalia Muta who reccommended for the patient to be seen a UC being that we do not have an opening for today or tomorrow. Patient notified.

## 2022-11-01 ENCOUNTER — Ambulatory Visit: Payer: BC Managed Care – PPO | Admitting: Physician Assistant

## 2022-11-01 ENCOUNTER — Encounter: Payer: Self-pay | Admitting: Physician Assistant

## 2022-11-01 VITALS — BP 128/78 | HR 87 | Temp 97.0°F | Ht 65.0 in | Wt 325.0 lb

## 2022-11-01 DIAGNOSIS — J069 Acute upper respiratory infection, unspecified: Secondary | ICD-10-CM | POA: Diagnosis not present

## 2022-11-01 LAB — POC COVID19 BINAXNOW: SARS Coronavirus 2 Ag: NEGATIVE

## 2022-11-01 MED ORDER — TRIAMCINOLONE ACETONIDE 40 MG/ML IJ SUSP: 60.0000 mg | Freq: Once | INTRAMUSCULAR | Status: DC

## 2022-11-01 MED ORDER — AZITHROMYCIN 250 MG PO TABS: ORAL_TABLET | ORAL | 0 refills | Status: AC

## 2022-11-01 NOTE — Progress Notes (Signed)
Acute Office Visit  Subjective:    Patient ID: Kellie Foster, female    DOB: 1991-11-08, 31 y.o.   MRN: [id number]  Chief Complaint  Patient presents with   Sinusitis    HPI: Patient is in today for complaints of cough, congestion , sore throat and sinus drainage for the past several days - symptoms worsened after blowing leaves Now has slightly productive cough and occ wheeze - is using albuterol, flonase and theraflu  Past Medical History:  Diagnosis Date   Hypertension    Obesity    Recurrent UTI (urinary tract infection)     History reviewed. No pertinent surgical history.  Family History  Problem Relation Age of Onset   Hypertension Mother    Diabetes Mother    Kidney cancer Mother    Hypertension Father     Social History   Socioeconomic History   Marital status: Single    Spouse name: Not on file   Number of children: Not on file   Years of education: Not on file   Highest education level: Not on file  Occupational History   Not on file  Tobacco Use   Smoking status: Never   Smokeless tobacco: Never   Tobacco comments:    2nd hand smoking exposure  Substance and Sexual Activity   Alcohol use: No   Drug use: No   Sexual activity: Not on file  Other Topics Concern   Not on file  Social History Narrative   Not on file   Social Determinants of Health   Financial Resource Strain: Not on file  Food Insecurity: Not on file  Transportation Needs: Not on file  Physical Activity: Not on file  Stress: Not on file  Social Connections: Not on file  Intimate Partner Violence: Not on file    Outpatient Medications Prior to Visit  Medication Sig Dispense Refill   fluticasone (FLONASE) 50 MCG/ACT nasal spray Place into the nose.     lisinopril (ZESTRIL) 10 MG tablet Take by mouth.     meloxicam (MOBIC) 15 MG tablet Take 1 tablet (15 mg total) by mouth daily. 90 tablet 1   metoprolol tartrate (LOPRESSOR) 50 MG tablet Take 1 tablet (50 mg total) by mouth 2  (two) times daily. 180 tablet 1   Norgestimate-Ethinyl Estradiol Triphasic (TRI-SPRINTEC) 0.18/0.215/0.25 MG-35 MCG tablet Take 1 tablet by mouth daily.     albuterol (VENTOLIN HFA) 108 (90 Base) MCG/ACT inhaler Inhale into the lungs. (Patient not taking: Reported on 03/02/2022)     Semaglutide-Weight Management (WEGOVY) 0.25 MG/0.5ML SOAJ Inject 0.25 mg into the skin once a week. 2 mL 0   No facility-administered medications prior to visit.    Allergies  Allergen Reactions   Ciprofloxacin Hcl Hives   Penicillins     Has patient had a PCN reaction causing immediate rash, facial/tongue/throat swelling, SOB or lightheadedness with hypotension:YES Has patient had a PCN reaction causing severe rash involving mucus membranes or skin necrosis:No Has patient had a PCN reaction that required hospitalization NO Has patient had a PCN reaction occurring within the last 10 years: NO If all of the above answers are "NO", then may proceed with Cephalosporin use.   Sulfa Antibiotics Rash    Review of Systems CONSTITUTIONAL: Negative for chills, fatigue, fever,  E/N/T: see HPI CARDIOVASCULAR: Negative for chest pain, dizziness, RESPIRATORY: see HPI GASTROINTESTINAL: Negative for abdominal pain, acid reflux symptoms, constipation, diarrhea, nausea and vomiting.  INTEGUMENTARY: Negative for rash.  Objective:  PHYSICAL EXAM:   VS: BP 128/78 (BP Location: Left Arm, Patient Position: Sitting, Cuff Size: Large)   Pulse 87   Temp (!) 97 F (36.1 C) (Temporal)   Ht _0  (1.651 m)   Wt (!) 325 lb (147.4 kg)   SpO2 97%   BMI 54.08 kg/m   GEN: Well nourished, well developed, in no acute distress  HEENT: normal external ears and nose - normal external auditory canals and TMS - hearing grossly normal -- Lips, Teeth and Gums - normal  Oropharynx -moderate erythema/pnd Cardiac: RRR; no murmurs, rubs,  Respiratory:  faint exp wheezes noted Skin: warm and dry, no rash   Office Visit on  11/01/2022  Component Date Value Ref Range Status   SARS Coronavirus 2 Ag 11/01/2022 Negative  Negative Final       There are no preventive care reminders to display for this patient.  There are no preventive care reminders to display for this patient.   Lab Results  Component Value Date   TSH 3.460 06/24/2022   Lab Results  Component Value Date   WBC 10.2 07/01/2022   HGB 14.7 07/01/2022   HCT 45.2 07/01/2022   MCV 86 07/01/2022   PLT 362 07/01/2022   Lab Results  Component Value Date   NA 136 06/24/2022   K 4.7 06/24/2022   CO2 22 06/24/2022   GLUCOSE 85 06/24/2022   BUN 10 06/24/2022   CREATININE 0.59 06/24/2022   BILITOT 0.5 06/24/2022   ALKPHOS 79 06/24/2022   AST 17 06/24/2022   ALT 19 06/24/2022   PROT 7.8 06/24/2022   ALBUMIN 4.2 06/24/2022   CALCIUM 9.8 06/24/2022   ANIONGAP 13 01/06/2016   EGFR 123 06/24/2022   Lab Results  Component Value Date   CHOL 162 06/24/2022   Lab Results  Component Value Date   HDL 50 06/24/2022   Lab Results  Component Value Date   LDLCALC 99 06/24/2022   Lab Results  Component Value Date   TRIG 68 06/24/2022   Lab Results  Component Value Date   CHOLHDL 3.2 06/24/2022   Lab Results  Component Value Date   HGBA1C 5.3 06/24/2022       Assessment & Plan:   Problem List Items Addressed This Visit   None Visit Diagnoses     Acute upper respiratory infection    -  Primary   Relevant Medications   triamcinolone acetonide (KENALOG-40) injection 60 mg (Start on 11/01/2022  9:45 AM)   azithromycin (ZITHROMAX) 250 MG tablet   Other Relevant Orders   POC COVID-19 BinaxNow (Completed) Continue otc decongestants and current meds as directed      Meds ordered this encounter  Medications   triamcinolone acetonide (KENALOG-40) injection 60 mg   azithromycin (ZITHROMAX) 250 MG tablet    Sig: Take 2 tablets on day 1, then 1 tablet daily on days 2 through 5    Dispense:  6 tablet    Refill:  0    Order  Specific Question:   Supervising Provider    AnswerRochel Brome [[id number]]    Orders Placed This Encounter  Procedures   POC COVID-19 BinaxNow     Follow-up: Return if symptoms worsen or fail to improve.  An After Visit Summary was printed and given to the patient.  Yetta Flock [facility] [phone]

## 2022-11-14 ENCOUNTER — Ambulatory Visit: Payer: BC Managed Care – PPO | Admitting: Physician Assistant

## 2022-11-14 ENCOUNTER — Encounter: Payer: Self-pay | Admitting: Physician Assistant

## 2022-11-14 VITALS — BP 122/80 | HR 89 | Temp 97.2°F | Ht 64.0 in | Wt 322.0 lb

## 2022-11-14 DIAGNOSIS — L723 Sebaceous cyst: Secondary | ICD-10-CM

## 2022-11-14 MED ORDER — DOXYCYCLINE HYCLATE 100 MG PO TABS: 100.0000 mg | ORAL_TABLET | Freq: Two times a day (BID) | ORAL | 0 refills | Status: DC

## 2022-11-14 NOTE — Progress Notes (Signed)
Acute Office Visit  Subjective:    Patient ID: Kellie Foster, female    DOB: 20-Jan-1991, 31 y.o.   MRN: [id number]  Chief Complaint  Patient presents with   Bump on Pelvic Area    HPI: Patient is in today for complaints of 'knot' in vaginal area.  States it has been there about a year but has never been infected before - last week area enlarged and became red and swollen.  Last night did start draining some blood Denies fever/malaise Denies other vaginal symptoms or urine symptoms  Past Medical History:  Diagnosis Date   Hypertension    Obesity    Recurrent UTI (urinary tract infection)     History reviewed. No pertinent surgical history.  Family History  Problem Relation Age of Onset   Hypertension Mother    Diabetes Mother    Kidney cancer Mother    Hypertension Father     Social History   Socioeconomic History   Marital status: Single    Spouse name: Not on file   Number of children: Not on file   Years of education: Not on file   Highest education level: Not on file  Occupational History   Not on file  Tobacco Use   Smoking status: Never   Smokeless tobacco: Never   Tobacco comments:    2nd hand smoking exposure  Substance and Sexual Activity   Alcohol use: No   Drug use: No   Sexual activity: Not on file  Other Topics Concern   Not on file  Social History Narrative   Not on file   Social Determinants of Health   Financial Resource Strain: Not on file  Food Insecurity: Not on file  Transportation Needs: Not on file  Physical Activity: Not on file  Stress: Not on file  Social Connections: Not on file  Intimate Partner Violence: Not on file    Outpatient Medications Prior to Visit  Medication Sig Dispense Refill   fluticasone (FLONASE) 50 MCG/ACT nasal spray Place into the nose.     lisinopril (ZESTRIL) 10 MG tablet Take by mouth.     meloxicam (MOBIC) 15 MG tablet Take 1 tablet (15 mg total) by mouth daily. 90 tablet 1   metoprolol tartrate  (LOPRESSOR) 50 MG tablet Take 1 tablet (50 mg total) by mouth 2 (two) times daily. 180 tablet 1   Norgestimate-Ethinyl Estradiol Triphasic (TRI-SPRINTEC) 0.18/0.215/0.25 MG-35 MCG tablet Take 1 tablet by mouth daily.     albuterol (VENTOLIN HFA) 108 (90 Base) MCG/ACT inhaler Inhale into the lungs. (Patient not taking: Reported on 03/02/2022)     Facility-Administered Medications Prior to Visit  Medication Dose Route Frequency Provider Last Rate Last Admin   triamcinolone acetonide (KENALOG-40) injection 60 mg  60 mg Intramuscular Once Marge Duncans, PA-C        Allergies  Allergen Reactions   Ciprofloxacin Hcl Hives   Penicillins     Has patient had a PCN reaction causing immediate rash, facial/tongue/throat swelling, SOB or lightheadedness with hypotension:YES Has patient had a PCN reaction causing severe rash involving mucus membranes or skin necrosis:No Has patient had a PCN reaction that required hospitalization NO Has patient had a PCN reaction occurring within the last 10 years: NO If all of the above answers are "NO", then may proceed with Cephalosporin use.   Sulfa Antibiotics Rash    Review of Systems    CONSTITUTIONAL: Negative for chills, fatigue, fever, CARDIOVASCULAR: Negative for chest pain, dizzinessRESPIRATORY: Negative for recent  cough and dyspnea.  INTEGUMENTARY: see HPI      Objective:  PHYSICAL EXAM:   VS: BP 122/80 (BP Location: Left Arm, Patient Position: Sitting, Cuff Size: Large)   Pulse 89   Temp (!) 97.2 F (36.2 C) (Temporal)   Ht _0  (1.626 m)   Wt (!) 322 lb (146.1 kg)   SpO2 98%   BMI 55.27 kg/m   GEN: Well nourished, well developed, in no acute distress  Cardiac: RRR; no murmurs, Respiratory:  normal respiratory rate and pattern with no distress - normal breath sounds with no rales, rhonchi, wheezes or rubs Skin: small cyst noted on mons pubis - no induration - no drainage - minimally inflamed  Lab Results  Component Value Date   TSH  3.460 06/24/2022   Lab Results  Component Value Date   WBC 10.2 07/01/2022   HGB 14.7 07/01/2022   HCT 45.2 07/01/2022   MCV 86 07/01/2022   PLT 362 07/01/2022   Lab Results  Component Value Date   NA 136 06/24/2022   K 4.7 06/24/2022   CO2 22 06/24/2022   GLUCOSE 85 06/24/2022   BUN 10 06/24/2022   CREATININE 0.59 06/24/2022   BILITOT 0.5 06/24/2022   ALKPHOS 79 06/24/2022   AST 17 06/24/2022   ALT 19 06/24/2022   PROT 7.8 06/24/2022   ALBUMIN 4.2 06/24/2022   CALCIUM 9.8 06/24/2022   ANIONGAP 13 01/06/2016   EGFR 123 06/24/2022   Lab Results  Component Value Date   CHOL 162 06/24/2022   Lab Results  Component Value Date   HDL 50 06/24/2022   Lab Results  Component Value Date   LDLCALC 99 06/24/2022   Lab Results  Component Value Date   TRIG 68 06/24/2022   Lab Results  Component Value Date   CHOLHDL 3.2 06/24/2022   Lab Results  Component Value Date   HGBA1C 5.3 06/24/2022       Assessment & Plan:   Problem List Items Addressed This Visit   None Visit Diagnoses     Sebaceous cyst    -  Primary   Relevant Medications   doxycycline (VIBRA-TABS) 100 MG tablet Recommend warm compresses      Meds ordered this encounter  Medications   doxycycline (VIBRA-TABS) 100 MG tablet    Sig: Take 1 tablet (100 mg total) by mouth 2 (two) times daily.    Dispense:  20 tablet    Refill:  0    Order Specific Question:   Supervising Provider    Answer:   Shelton Silvas    No orders of the defined types were placed in this encounter.    Follow-up: Return if symptoms worsen or fail to improve.  An After Visit Summary was printed and given to the patient.  Yetta Flock [facility] [phone]

## 2022-12-07 ENCOUNTER — Ambulatory Visit: Payer: BC Managed Care – PPO | Admitting: Family Medicine

## 2022-12-07 VITALS — BP 138/90 | HR 112 | Temp 97.4°F | Resp 18 | Ht 64.0 in | Wt 323.0 lb

## 2022-12-07 DIAGNOSIS — J029 Acute pharyngitis, unspecified: Secondary | ICD-10-CM | POA: Diagnosis not present

## 2022-12-07 DIAGNOSIS — J111 Influenza due to unidentified influenza virus with other respiratory manifestations: Secondary | ICD-10-CM | POA: Diagnosis not present

## 2022-12-07 DIAGNOSIS — R051 Acute cough: Secondary | ICD-10-CM | POA: Diagnosis not present

## 2022-12-07 LAB — POCT INFLUENZA A/B
Influenza A, POC: NEGATIVE
Influenza B, POC: NEGATIVE

## 2022-12-07 LAB — POCT RAPID STREP A (OFFICE): Rapid Strep A Screen: NEGATIVE

## 2022-12-07 LAB — POC COVID19 BINAXNOW: SARS Coronavirus 2 Ag: NEGATIVE

## 2022-12-07 MED ORDER — OSELTAMIVIR PHOSPHATE 75 MG PO CAPS: 75.0000 mg | ORAL_CAPSULE | Freq: Two times a day (BID) | ORAL | 0 refills | Status: AC

## 2022-12-07 NOTE — Progress Notes (Signed)
Acute Office Visit  Subjective:    Patient ID: Kellie Foster, female    DOB: 07-Dec-1991, 31 y.o.   MRN: [id number]  Chief Complaint  Patient presents with   head congestion    HPI: Patient is in today for head congestion and body aches, chills.  All this has started today. Cough began yesterday. Exposed to the flu. Has a mild headache that just started. No fever.   Past Medical History:  Diagnosis Date   Hypertension    Obesity    Recurrent UTI (urinary tract infection)     History reviewed. No pertinent surgical history.  Family History  Problem Relation Age of Onset   Hypertension Mother    Diabetes Mother    Kidney cancer Mother    Hypertension Father     Social History   Socioeconomic History   Marital status: Single    Spouse name: Not on file   Number of children: Not on file   Years of education: Not on file   Highest education level: Not on file  Occupational History   Not on file  Tobacco Use   Smoking status: Never   Smokeless tobacco: Never   Tobacco comments:    2nd hand smoking exposure  Substance and Sexual Activity   Alcohol use: No   Drug use: No   Sexual activity: Not on file  Other Topics Concern   Not on file  Social History Narrative   Not on file   Social Determinants of Health   Financial Resource Strain: Not on file  Food Insecurity: Not on file  Transportation Needs: Not on file  Physical Activity: Not on file  Stress: Not on file  Social Connections: Not on file  Intimate Partner Violence: Not on file    Outpatient Medications Prior to Visit  Medication Sig Dispense Refill   albuterol (VENTOLIN HFA) 108 (90 Base) MCG/ACT inhaler Inhale into the lungs. (Patient not taking: Reported on 03/02/2022)     doxycycline (VIBRA-TABS) 100 MG tablet Take 1 tablet (100 mg total) by mouth 2 (two) times daily. 20 tablet 0   fluticasone (FLONASE) 50 MCG/ACT nasal spray Place into the nose.     lisinopril (ZESTRIL) 10 MG tablet Take by  mouth.     meloxicam (MOBIC) 15 MG tablet Take 1 tablet (15 mg total) by mouth daily. 90 tablet 1   metoprolol tartrate (LOPRESSOR) 50 MG tablet Take 1 tablet (50 mg total) by mouth 2 (two) times daily. 180 tablet 1   Norgestimate-Ethinyl Estradiol Triphasic (TRI-SPRINTEC) 0.18/0.215/0.25 MG-35 MCG tablet Take 1 tablet by mouth daily.     Facility-Administered Medications Prior to Visit  Medication Dose Route Frequency Provider Last Rate Last Admin   triamcinolone acetonide (KENALOG-40) injection 60 mg  60 mg Intramuscular Once Marge Duncans, PA-C        Allergies  Allergen Reactions   Ciprofloxacin Hcl Hives   Penicillins     Has patient had a PCN reaction causing immediate rash, facial/tongue/throat swelling, SOB or lightheadedness with hypotension:YES Has patient had a PCN reaction causing severe rash involving mucus membranes or skin necrosis:No Has patient had a PCN reaction that required hospitalization NO Has patient had a PCN reaction occurring within the last 10 years: NO If all of the above answers are "NO", then may proceed with Cephalosporin use.   Sulfa Antibiotics Rash    Review of Systems  Constitutional:  Positive for chills, fatigue and fever.  HENT:  Positive for congestion. Negative for  ear pain, sinus pain and sore throat.   Respiratory:  Positive for cough. Negative for shortness of breath.   Cardiovascular:  Negative for chest pain and leg swelling.  Gastrointestinal:  Negative for abdominal pain, constipation, diarrhea, nausea and vomiting.  Genitourinary:  Negative for dysuria and frequency.  Musculoskeletal:  Negative for arthralgias, back pain and myalgias.  Neurological:  Positive for dizziness and headaches.       Objective:    Physical Exam Vitals reviewed.  Constitutional:      Appearance: Normal appearance. She is normal weight.  HENT:     Right Ear: Tympanic membrane normal.     Left Ear: Tympanic membrane normal.     Nose: Congestion present.      Mouth/Throat:     Pharynx: Posterior oropharyngeal erythema present. No oropharyngeal exudate.  Neck:     Vascular: No carotid bruit.  Cardiovascular:     Rate and Rhythm: Normal rate and regular rhythm.     Heart sounds: Normal heart sounds.  Pulmonary:     Effort: Pulmonary effort is normal.     Breath sounds: Normal breath sounds.  Lymphadenopathy:     Cervical: Cervical adenopathy present.  Neurological:     Mental Status: She is alert and oriented to person, place, and time.  Psychiatric:        Mood and Affect: Mood normal.        Behavior: Behavior normal.     BP (!) 138/90   Pulse (!) 112   Temp (!) 97.4 F (36.3 C)   Resp 18   Ht _0  (1.626 m)   Wt (!) 323 lb (146.5 kg)   SpO2 99%   BMI 55.44 kg/m  Wt Readings from Last 3 Encounters:  12/07/22 (!) 323 lb (146.5 kg)  11/14/22 (!) 322 lb (146.1 kg)  11/01/22 (!) 325 lb (147.4 kg)    There are no preventive care reminders to display for this patient.  There are no preventive care reminders to display for this patient.   Lab Results  Component Value Date   TSH 3.460 06/24/2022   Lab Results  Component Value Date   WBC 10.2 07/01/2022   HGB 14.7 07/01/2022   HCT 45.2 07/01/2022   MCV 86 07/01/2022   PLT 362 07/01/2022   Lab Results  Component Value Date   NA 136 06/24/2022   K 4.7 06/24/2022   CO2 22 06/24/2022   GLUCOSE 85 06/24/2022   BUN 10 06/24/2022   CREATININE 0.59 06/24/2022   BILITOT 0.5 06/24/2022   ALKPHOS 79 06/24/2022   AST 17 06/24/2022   ALT 19 06/24/2022   PROT 7.8 06/24/2022   ALBUMIN 4.2 06/24/2022   CALCIUM 9.8 06/24/2022   ANIONGAP 13 01/06/2016   EGFR 123 06/24/2022   Lab Results  Component Value Date   CHOL 162 06/24/2022   Lab Results  Component Value Date   HDL 50 06/24/2022   Lab Results  Component Value Date   LDLCALC 99 06/24/2022   Lab Results  Component Value Date   TRIG 68 06/24/2022   Lab Results  Component Value Date   CHOLHDL 3.2  06/24/2022   Lab Results  Component Value Date   HGBA1C 5.3 06/24/2022       Assessment & Plan:   Problem List Items Addressed This Visit       Respiratory   Influenza    Prescription tamiflu.  - no evidence of AOM, CAP, strep pharyngitis. Concerning for the  flu. I am concerned test was too early.  - discussed symptomatic management (flonase, decongestants, etc), natural course, and return precautions   Education given.         Other   Acute cough - Primary   Relevant Orders   POC COVID-19 BinaxNow (Completed)   POCT Influenza A/B (Completed)   Sore throat   Relevant Orders   POCT rapid strep A (Completed)   Meds ordered this encounter  Medications   oseltamivir (TAMIFLU) 75 MG capsule    Sig: Take 1 capsule (75 mg total) by mouth 2 (two) times daily for 5 days.    Dispense:  10 capsule    Refill:  0    Orders Placed This Encounter  Procedures   POC COVID-19 BinaxNow   POCT Influenza A/B   POCT rapid strep A     Follow-up: Return if symptoms worsen or fail to improve.  An After Visit Summary was printed and given to the patient.  Rochel Brome, MD [facility] [phone]

## 2022-12-13 ENCOUNTER — Encounter: Payer: Self-pay | Admitting: Family Medicine

## 2022-12-13 NOTE — Assessment & Plan Note (Addendum)
Prescription tamiflu.  - no evidence of AOM, CAP, strep pharyngitis. Concerning for the flu. I am concerned test was too early.  - discussed symptomatic management (flonase, decongestants, etc), natural course, and return precautions   Education given.

## 2023-03-02 ENCOUNTER — Ambulatory Visit: Payer: BC Managed Care – PPO | Admitting: Physician Assistant

## 2023-03-02 ENCOUNTER — Encounter: Payer: Self-pay | Admitting: Physician Assistant

## 2023-03-02 VITALS — BP 130/80 | HR 71 | Temp 97.1°F | Ht 64.0 in | Wt 324.0 lb

## 2023-03-02 DIAGNOSIS — N342 Other urethritis: Secondary | ICD-10-CM

## 2023-03-02 DIAGNOSIS — R300 Dysuria: Secondary | ICD-10-CM | POA: Diagnosis not present

## 2023-03-02 LAB — POCT URINALYSIS DIP (CLINITEK)
Bilirubin, UA: NEGATIVE
Blood, UA: NEGATIVE
Glucose, UA: NEGATIVE mg/dL
Leukocytes, UA: NEGATIVE
Nitrite, UA: NEGATIVE
Spec Grav, UA: 1.025
Urobilinogen, UA: 0.2 U/dL
pH, UA: 6

## 2023-03-02 MED ORDER — DOXYCYCLINE HYCLATE 100 MG PO TABS: 100.0000 mg | ORAL_TABLET | Freq: Two times a day (BID) | ORAL | 0 refills | Status: DC

## 2023-03-02 NOTE — Progress Notes (Signed)
Acute Office Visit  Subjective:    Patient ID: Kellie Foster, female    DOB: 10-Sep-1991, 32 y.o.   MRN: [id number]  Chief Complaint  Patient presents with   Burning with Urination    HPI: Patient is in today for complaints of dysuria for the past several days.  She denies fever, malaise, GI symptoms, or back pain.  Denies vaginal symptoms.  LMP was 3 weeks ago and was normal She denies polyuria or hematuria  Past Medical History:  Diagnosis Date   Hypertension    Obesity    Recurrent UTI (urinary tract infection)     History reviewed. No pertinent surgical history.  Family History  Problem Relation Age of Onset   Hypertension Mother    Diabetes Mother    Kidney cancer Mother    Hypertension Father     Social History   Socioeconomic History   Marital status: Single    Spouse name: Not on file   Number of children: Not on file   Years of education: Not on file   Highest education level: Not on file  Occupational History   Not on file  Tobacco Use   Smoking status: Never   Smokeless tobacco: Never   Tobacco comments:    2nd hand smoking exposure  Substance and Sexual Activity   Alcohol use: No   Drug use: No   Sexual activity: Not on file  Other Topics Concern   Not on file  Social History Narrative   Not on file   Social Determinants of Health   Financial Resource Strain: Not on file  Food Insecurity: Not on file  Transportation Needs: Not on file  Physical Activity: Not on file  Stress: Not on file  Social Connections: Not on file  Intimate Partner Violence: Not on file    Outpatient Medications Prior to Visit  Medication Sig Dispense Refill   fluticasone (FLONASE) 50 MCG/ACT nasal spray Place into the nose.     lisinopril (ZESTRIL) 10 MG tablet Take by mouth.     meloxicam (MOBIC) 15 MG tablet Take 1 tablet (15 mg total) by mouth daily. 90 tablet 1   metoprolol tartrate (LOPRESSOR) 50 MG tablet Take 1 tablet (50 mg total) by mouth 2 (two) times  daily. 180 tablet 1   Norgestimate-Ethinyl Estradiol Triphasic (TRI-SPRINTEC) 0.18/0.215/0.25 MG-35 MCG tablet Take 1 tablet by mouth daily.     albuterol (VENTOLIN HFA) 108 (90 Base) MCG/ACT inhaler Inhale into the lungs. (Patient not taking: Reported on 03/02/2022)     doxycycline (VIBRA-TABS) 100 MG tablet Take 1 tablet (100 mg total) by mouth 2 (two) times daily. 20 tablet 0   Facility-Administered Medications Prior to Visit  Medication Dose Route Frequency Provider Last Rate Last Admin   triamcinolone acetonide (KENALOG-40) injection 60 mg  60 mg Intramuscular Once Marge Duncans, PA-C        Allergies  Allergen Reactions   Ciprofloxacin Hcl Hives   Penicillins     Has patient had a PCN reaction causing immediate rash, facial/tongue/throat swelling, SOB or lightheadedness with hypotension:YES Has patient had a PCN reaction causing severe rash involving mucus membranes or skin necrosis:No Has patient had a PCN reaction that required hospitalization NO Has patient had a PCN reaction occurring within the last 10 years: NO If all of the above answers are "NO", then may proceed with Cephalosporin use.   Sulfa Antibiotics Rash    Review of Systems CONSTITUTIONAL: Negative for chills, fatigue, fever, .  CARDIOVASCULAR:  Negative for chest pain,  RESPIRATORY: Negative for recent cough and dyspnea.  GASTROINTESTINAL: Negative for abdominal pain, acid reflux symptoms, constipation, diarrhea, nausea and vomiting.  GU - see HPI     Objective:    PHYSICAL EXAM:   VS: BP (!) 130/90 (BP Location: Left Arm, Patient Position: Sitting, Cuff Size: Large)   Pulse 71   Temp (!) 97.1 F (36.2 C) (Temporal)   Ht 5\' 4"  (1.626 m)   Wt (!) 324 lb (147 kg)   SpO2 99%   BMI 55.61 kg/m   GEN: Well nourished, well developed, in no acute distress  Cardiac: RRR; no murmurs,  Respiratory:  normal respiratory rate and pattern with no distress - normal breath sounds with no rales, rhonchi, wheezes or  rubs GI: normal bowel sounds, no masses or tenderness  Office Visit on 03/02/2023  Component Date Value Ref Range Status   Color, UA 03/02/2023 yellow  yellow Final   Clarity, UA 03/02/2023 clear  clear Final   Glucose, UA 03/02/2023 negative  negative mg/dL Final   Bilirubin, UA 03/02/2023 negative  negative Final   Ketones, POC UA 03/02/2023 trace (5) (A)  negative mg/dL Final   Spec Grav, UA 03/02/2023 1.025  1.010 - 1.025 Final   Blood, UA 03/02/2023 negative  negative Final   pH, UA 03/02/2023 6.0  5.0 - 8.0 Final   POC PROTEIN,UA 03/02/2023 trace  negative, trace Final   Urobilinogen, UA 03/02/2023 0.2  0.2 or 1.0 E.U./dL Final   Nitrite, UA 03/02/2023 Negative  Negative Final   Leukocytes, UA 03/02/2023 Negative  Negative Final      Lab Results  Component Value Date   TSH 3.460 06/24/2022   Lab Results  Component Value Date   WBC 10.2 07/01/2022   HGB 14.7 07/01/2022   HCT 45.2 07/01/2022   MCV 86 07/01/2022   PLT 362 07/01/2022   Lab Results  Component Value Date   NA 136 06/24/2022   K 4.7 06/24/2022   CO2 22 06/24/2022   GLUCOSE 85 06/24/2022   BUN 10 06/24/2022   CREATININE 0.59 06/24/2022   BILITOT 0.5 06/24/2022   ALKPHOS 79 06/24/2022   AST 17 06/24/2022   ALT 19 06/24/2022   PROT 7.8 06/24/2022   ALBUMIN 4.2 06/24/2022   CALCIUM 9.8 06/24/2022   ANIONGAP 13 01/06/2016   EGFR 123 06/24/2022   Lab Results  Component Value Date   CHOL 162 06/24/2022   Lab Results  Component Value Date   HDL 50 06/24/2022   Lab Results  Component Value Date   LDLCALC 99 06/24/2022   Lab Results  Component Value Date   TRIG 68 06/24/2022   Lab Results  Component Value Date   CHOLHDL 3.2 06/24/2022   Lab Results  Component Value Date   HGBA1C 5.3 06/24/2022       Assessment & Plan:  Burning with urination -     POCT URINALYSIS DIP (CLINITEK)  Urethritis -     Urine Culture -     Doxycycline Hyclate; Take 1 tablet (100 mg total) by mouth 2  (two) times daily.  Dispense: 20 tablet; Refill: 0     Meds ordered this encounter  Medications   doxycycline (VIBRA-TABS) 100 MG tablet    Sig: Take 1 tablet (100 mg total) by mouth 2 (two) times daily.    Dispense:  20 tablet    Refill:  0    Order Specific Question:   Supervising Provider  AnswerShelton Silvas    Orders Placed This Encounter  Procedures   Urine Culture   POCT URINALYSIS DIP (CLINITEK)     Follow-up: Return if symptoms worsen or fail to improve.  An After Visit Summary was printed and given to the patient.  Yetta Flock [facility] [phone]

## 2023-03-03 LAB — URINE CULTURE: Organism ID, Bacteria: NO GROWTH

## 2023-05-31 ENCOUNTER — Encounter: Payer: Self-pay | Admitting: Physician Assistant

## 2023-05-31 ENCOUNTER — Ambulatory Visit: Payer: BC Managed Care – PPO | Admitting: Physician Assistant

## 2023-05-31 VITALS — BP 134/86 | HR 96 | Temp 97.1°F | Ht 64.0 in | Wt 335.8 lb

## 2023-05-31 DIAGNOSIS — H6991 Unspecified Eustachian tube disorder, right ear: Secondary | ICD-10-CM

## 2023-05-31 DIAGNOSIS — J3089 Other allergic rhinitis: Secondary | ICD-10-CM | POA: Diagnosis not present

## 2023-05-31 MED ORDER — CETIRIZINE HCL 10 MG PO TABS
10.0000 mg | ORAL_TABLET | Freq: Every day | ORAL | 3 refills | Status: DC
Start: 2023-05-31 — End: 2024-08-21

## 2023-05-31 MED ORDER — FLUTICASONE PROPIONATE 50 MCG/ACT NA SUSP
2.0000 | Freq: Every day | NASAL | 3 refills | Status: DC
Start: 2023-05-31 — End: 2024-08-21

## 2023-05-31 MED ORDER — TRIAMCINOLONE ACETONIDE 40 MG/ML IJ SUSP
60.0000 mg | Freq: Once | INTRAMUSCULAR | Status: AC
Start: 2023-05-31 — End: 2023-05-31
  Administered 2023-05-31: 60 mg via INTRAMUSCULAR

## 2023-05-31 NOTE — Progress Notes (Signed)
Acute Office Visit  Subjective:    Patient ID: Kellie Foster, female    DOB: 04/09/1991, 32 y.o.   MRN: [id number]  Chief Complaint  Patient presents with   Allergies    HPI: Patient is in today for complaints of allergies and feeling like right ear is stopped up at times - she has clear congestion but without fever or malaise -- she takes zyrtec but has not been taking flonase (requests refill)   Current Outpatient Medications:    cetirizine (ZYRTEC ALLERGY) 10 MG tablet, Take 1 tablet (10 mg total) by mouth daily., Disp: 30 tablet, Rfl: 3   lisinopril (ZESTRIL) 10 MG tablet, Take by mouth., Disp: , Rfl:    meloxicam (MOBIC) 15 MG tablet, Take 1 tablet (15 mg total) by mouth daily., Disp: 90 tablet, Rfl: 1   metoprolol tartrate (LOPRESSOR) 50 MG tablet, Take 1 tablet (50 mg total) by mouth 2 (two) times daily., Disp: 180 tablet, Rfl: 1   Norgestimate-Ethinyl Estradiol Triphasic (TRI-SPRINTEC) 0.18/0.215/0.25 MG-35 MCG tablet, Take 1 tablet by mouth daily., Disp: , Rfl:    fluticasone (FLONASE) 50 MCG/ACT nasal spray, Place 2 sprays into both nostrils daily., Disp: 15.8 mL, Rfl: 3  Allergies  Allergen Reactions   Ciprofloxacin Hcl Hives   Penicillins Hives    Has patient had a PCN reaction causing immediate rash, facial/tongue/throat swelling, SOB or lightheadedness with hypotension:YES  Has patient had a PCN reaction causing severe rash involving mucus membranes or skin necrosis:No  Has patient had a PCN reaction that required hospitalization NO  Has patient had a PCN reaction occurring within the last 10 years: NO  If all of the above answers are "NO", then may proceed with Cephalosporin use.   Sulfa Antibiotics Rash    ROS CONSTITUTIONAL: Negative for chills, fatigue, fever, E/N/T: see  HPI CARDIOVASCULAR: Negative for chest pain, dizziness, RESPIRATORY: Negative for recent cough and dyspnea.      Objective:    PHYSICAL EXAM:   BP 134/86 (BP Location: Left Arm,  Patient Position: Sitting, Cuff Size: Large)   Pulse 96   Temp (!) 97.1 F (36.2 C) (Temporal)   Ht 5\' 4"  (1.626 m)   Wt (!) 335 lb 12.8 oz (152.3 kg)   SpO2 96%   BMI 57.64 kg/m    GEN: Well nourished, well developed, in no acute distress  HEENT: normal external ears and nose - normal external auditory canals and TMS  - Lips, Teeth and Gums - normal  Oropharynx - normal mucosa, palate, and posterior pharynx  Cardiac: RRR; no murmurs, rubs, Respiratory:  normal respiratory rate and pattern with no distress - normal breath sounds with no rales, rhonchi, wheezes or rubs      Assessment & Plan:    Seasonal allergic rhinitis due to other allergic trigger -     Triamcinolone Acetonide -     Fluticasone Propionate; Place 2 sprays into both nostrils daily.  Dispense: 15.8 mL; Refill: 3 -     Cetirizine HCl; Take 1 tablet (10 mg total) by mouth daily.  Dispense: 30 tablet; Refill: 3  Dysfunction of right eustachian tube -     Triamcinolone Acetonide -     Fluticasone Propionate; Place 2 sprays into both nostrils daily.  Dispense: 15.8 mL; Refill: 3     Follow-up: Return in about 3 months (around 08/31/2023) for chronic fasting follow-up.  An After Visit Summary was printed and given to the patient.  Vickey Sages, PA-C [facility] (  [phone] 

## 2023-06-11 ENCOUNTER — Other Ambulatory Visit: Payer: Self-pay | Admitting: Physician Assistant

## 2023-09-07 ENCOUNTER — Ambulatory Visit: Payer: BC Managed Care – PPO

## 2023-09-07 VITALS — BP 120/80 | HR 74 | Temp 97.2°F | Resp 14 | Ht 64.0 in | Wt 334.0 lb

## 2023-09-07 DIAGNOSIS — L739 Follicular disorder, unspecified: Secondary | ICD-10-CM | POA: Diagnosis not present

## 2023-09-07 DIAGNOSIS — M79671 Pain in right foot: Secondary | ICD-10-CM

## 2023-09-07 MED ORDER — NORGESTIM-ETH ESTRAD TRIPHASIC 0.18/0.215/0.25 MG-35 MCG PO TABS: 1.0000 | ORAL_TABLET | Freq: Every day | ORAL | 5 refills | Status: AC

## 2023-09-07 MED ORDER — LISINOPRIL 10 MG PO TABS: 10.0000 mg | ORAL_TABLET | Freq: Every day | ORAL | 0 refills | Status: DC

## 2023-09-07 MED ORDER — METOPROLOL TARTRATE 50 MG PO TABS: 50.0000 mg | ORAL_TABLET | Freq: Two times a day (BID) | ORAL | 0 refills | Status: DC

## 2023-09-07 MED ORDER — MELOXICAM 15 MG PO TABS: 15.0000 mg | ORAL_TABLET | Freq: Every day | ORAL | 1 refills | Status: DC

## 2023-09-07 NOTE — Assessment & Plan Note (Signed)
Patient has a small follicular abscess which is not fluctuant, does not have a pus point. No warmth noted either.  PLAN: does not need I and D or antibiotics at this time. - advised to apply moist heat to the area and bacitracin if it opens up. - to Report back if any worsening symptoms

## 2023-09-07 NOTE — Progress Notes (Signed)
Acute Office Visit  Subjective:    Patient ID: Kellie Foster, female    DOB: [birth year], 32 y.o.   MRN: [id number]  No chief complaint on file.   HPI: Patient is in today for cyst on above of the vagina since 2 days ago. She noticed red-yellowish discharge yesterday. It was very tender to the touch or walk, but today is getting better. She denies fever, chills.  Past Medical History:  Diagnosis Date   Hypertension    Obesity    Recurrent UTI (urinary tract infection)     History reviewed. No pertinent surgical history.  Family History  Problem Relation Age of Onset   Hypertension Mother    Diabetes Mother    Kidney cancer Mother    Hypertension Father     Social History   Socioeconomic History   Marital status: Single    Spouse name: Not on file   Number of children: Not on file   Years of education: Not on file   Highest education level: Not on file  Occupational History   Not on file  Tobacco Use   Smoking status: Never   Smokeless tobacco: Never   Tobacco comments:    2nd hand smoking exposure  Substance and Sexual Activity   Alcohol use: No   Drug use: No   Sexual activity: Not on file  Other Topics Concern   Not on file  Social History Narrative   Not on file   Social Determinants of Health   Financial Resource Strain: Low Risk  (05/31/2023)   Overall Financial Resource Strain (CARDIA)    Difficulty of Paying Living Expenses: Not hard at all  Food Insecurity: No Food Insecurity (05/31/2023)   Hunger Vital Sign    Worried About Running Out of Food in the Last Year: Never true    Ran Out of Food in the Last Year: Never true  Transportation Needs: No Transportation Needs (05/31/2023)   PRAPARE - Administrator, Civil Service (Medical): No    Lack of Transportation (Non-Medical): No  Physical Activity: Inactive (05/31/2023)   Exercise Vital Sign    Days of Exercise per Week: 0 days    Minutes of Exercise per Session: 0 min  Stress: No  Stress Concern Present (05/31/2023)   [employer] of Occupational Health - Occupational Stress Questionnaire    Feeling of Stress : Not at all  Social Connections: Unknown (05/31/2023)   Social Connection and Isolation Panel [NHANES]    Frequency of Communication with Friends and Family: More than three times a week    Frequency of Social Gatherings with Friends and Family: Three times a week    Attends Religious Services: 1 to 4 times per year    Active Member of Clubs or Organizations: No    Attends Banker Meetings: Never    Marital Status: Not on file  Intimate Partner Violence: Not At Risk (05/31/2023)   Humiliation, Afraid, Rape, and Kick questionnaire    Fear of Current or Ex-Partner: No    Emotionally Abused: No    Physically Abused: No    Sexually Abused: No    Outpatient Medications Prior to Visit  Medication Sig Dispense Refill   cetirizine (ZYRTEC ALLERGY) 10 MG tablet Take 1 tablet (10 mg total) by mouth daily. 30 tablet 3   fluticasone (FLONASE) 50 MCG/ACT nasal spray Place 2 sprays into both nostrils daily. 15.8 mL 3   lisinopril (ZESTRIL) 10 MG tablet Take by mouth.  meloxicam (MOBIC) 15 MG tablet Take 1 tablet (15 mg total) by mouth daily. 90 tablet 1   metoprolol tartrate (LOPRESSOR) 50 MG tablet Take 1 tablet by mouth twice daily 180 tablet 0   Norgestimate-Ethinyl Estradiol Triphasic (TRI-SPRINTEC) 0.18/0.215/0.25 MG-35 MCG tablet Take 1 tablet by mouth daily.     No facility-administered medications prior to visit.    Allergies  Allergen Reactions   Ciprofloxacin Hcl Hives   Penicillins Hives    Has patient had a PCN reaction causing immediate rash, facial/tongue/throat swelling, SOB or lightheadedness with hypotension:YES  Has patient had a PCN reaction causing severe rash involving mucus membranes or skin necrosis:No  Has patient had a PCN reaction that required hospitalization NO  Has patient had a PCN reaction occurring within the  last 10 years: NO  If all of the above answers are "NO", then may proceed with Cephalosporin use.   Sulfa Antibiotics Rash    Review of Systems  Constitutional:  Negative for chills, fatigue and fever.  HENT:  Negative for congestion, ear pain and sore throat.   Respiratory:  Negative for cough and shortness of breath.   Cardiovascular:  Negative for chest pain and palpitations.  Gastrointestinal:  Negative for abdominal pain, constipation, diarrhea, nausea and vomiting.  Endocrine: Negative for polydipsia, polyphagia and polyuria.  Genitourinary:  Negative for difficulty urinating and dysuria.  Musculoskeletal:  Positive for arthralgias. Negative for back pain and myalgias.  Skin:  Positive for color change (redness). Negative for rash and wound.  Neurological:  Negative for headaches.  Psychiatric/Behavioral:  Negative for dysphoric mood. The patient is not nervous/anxious.        Objective:        09/07/2023   10:59 AM 05/31/2023    1:23 PM 03/02/2023    9:52 AM  Vitals with BMI  Height 5\' 4"  5\' 4"    Weight 334 lbs 335 lbs 13 oz   BMI 57.3 57.61   Systolic 120 134 962  Diastolic 80 86 80  Pulse 74 96     No data found.   Physical Exam Vitals reviewed.  Constitutional:      Appearance: She is obese.  HENT:     Head: Normocephalic and atraumatic.  Cardiovascular:     Rate and Rhythm: Normal rate and regular rhythm.  Pulmonary:     Effort: Pulmonary effort is normal.     Breath sounds: Normal breath sounds.  Skin:    Findings: Erythema (mild erythema and folliculitis noted in pubic area. Small abscess which seems to be healing) present.  Neurological:     Mental Status: She is alert.     Health Maintenance Due  Topic Date Due   HIV Screening  Never done   INFLUENZA VACCINE  07/13/2023    There are no preventive care reminders to display for this patient.   Lab Results  Component Value Date   TSH 3.460 06/24/2022   Lab Results  Component Value Date    WBC 10.2 07/01/2022   HGB 14.7 07/01/2022   HCT 45.2 07/01/2022   MCV 86 07/01/2022   PLT 362 07/01/2022   Lab Results  Component Value Date   NA 136 06/24/2022   K 4.7 06/24/2022   CO2 22 06/24/2022   GLUCOSE 85 06/24/2022   BUN 10 06/24/2022   CREATININE 0.59 06/24/2022   BILITOT 0.5 06/24/2022   ALKPHOS 79 06/24/2022   AST 17 06/24/2022   ALT 19 06/24/2022   PROT 7.8 06/24/2022  ALBUMIN 4.2 06/24/2022   CALCIUM 9.8 06/24/2022   ANIONGAP 13 01/06/2016   EGFR 123 06/24/2022   Lab Results  Component Value Date   CHOL 162 06/24/2022   Lab Results  Component Value Date   HDL 50 06/24/2022   Lab Results  Component Value Date   LDLCALC 99 06/24/2022   Lab Results  Component Value Date   TRIG 68 06/24/2022   Lab Results  Component Value Date   CHOLHDL 3.2 06/24/2022   Lab Results  Component Value Date   HGBA1C 5.3 06/24/2022       Assessment & Plan:  Folliculitis Assessment & Plan: Patient has a small follicular abscess which is not fluctuant, does not have a pus point. No warmth noted either.  PLAN: does not need I and D or antibiotics at this time. - advised to apply moist heat to the area and bacitracin if it opens up. - to Report back if any worsening symptoms      Pain of right heel Assessment & Plan: Refilled meloxicam for her chronic right heel pain.  Orders: -     Meloxicam; Take 1 tablet (15 mg total) by mouth daily.  Dispense: 90 tablet; Refill: 1  Other orders -     Lisinopril; Take 1 tablet (10 mg total) by mouth daily.  Dispense: 90 tablet; Refill: 0 -     Metoprolol Tartrate; Take 1 tablet (50 mg total) by mouth 2 (two) times daily.  Dispense: 180 tablet; Refill: 0 -     Norgestim-Eth Estrad Triphasic; Take 1 tablet by mouth daily.  Dispense: 28 tablet; Refill: 5     Meds ordered this encounter  Medications   lisinopril (ZESTRIL) 10 MG tablet    Sig: Take 1 tablet (10 mg total) by mouth daily.    Dispense:  90 tablet     Refill:  0   metoprolol tartrate (LOPRESSOR) 50 MG tablet    Sig: Take 1 tablet (50 mg total) by mouth 2 (two) times daily.    Dispense:  180 tablet    Refill:  0   Norgestimate-Ethinyl Estradiol Triphasic (TRI-SPRINTEC) 0.18/0.215/0.25 MG-35 MCG tablet    Sig: Take 1 tablet by mouth daily.    Dispense:  28 tablet    Refill:  5   meloxicam (MOBIC) 15 MG tablet    Sig: Take 1 tablet (15 mg total) by mouth daily.    Dispense:  90 tablet    Refill:  1    No orders of the defined types were placed in this encounter.    Follow-up: Return if symptoms worsen or fail to improve.  An After Visit Summary was printed and given to the patient.  Windell Moment, MD [facility] [phone]

## 2023-09-07 NOTE — Patient Instructions (Signed)
No need of antibiotics today Refilled your medicines Follow up as needed

## 2023-09-07 NOTE — Assessment & Plan Note (Signed)
Refilled meloxicam for her chronic right heel pain.

## 2023-10-17 ENCOUNTER — Ambulatory Visit (INDEPENDENT_AMBULATORY_CARE_PROVIDER_SITE_OTHER): Payer: BC Managed Care – PPO | Admitting: Family Medicine

## 2023-10-17 ENCOUNTER — Encounter: Payer: Self-pay | Admitting: Family Medicine

## 2023-10-17 VITALS — BP 118/72 | HR 72 | Resp 16 | Ht 64.0 in | Wt 338.2 lb

## 2023-10-17 DIAGNOSIS — Z6841 Body Mass Index (BMI) 40.0 and over, adult: Secondary | ICD-10-CM

## 2023-10-17 DIAGNOSIS — I10 Essential (primary) hypertension: Secondary | ICD-10-CM

## 2023-10-17 DIAGNOSIS — Z124 Encounter for screening for malignant neoplasm of cervix: Secondary | ICD-10-CM | POA: Insufficient documentation

## 2023-10-17 DIAGNOSIS — E282 Polycystic ovarian syndrome: Secondary | ICD-10-CM | POA: Diagnosis not present

## 2023-10-17 DIAGNOSIS — Z0000 Encounter for general adult medical examination without abnormal findings: Secondary | ICD-10-CM | POA: Insufficient documentation

## 2023-10-17 MED ORDER — LISINOPRIL 10 MG PO TABS: 10.0000 mg | ORAL_TABLET | Freq: Every day | ORAL | 3 refills | Status: DC

## 2023-10-17 MED ORDER — METFORMIN HCL 500 MG PO TABS: 500.0000 mg | ORAL_TABLET | Freq: Every day | ORAL | 0 refills | Status: DC

## 2023-10-17 MED ORDER — SEMAGLUTIDE-WEIGHT MANAGEMENT 0.25 MG/0.5ML ~~LOC~~ SOAJ: 0.2500 mg | SUBCUTANEOUS | 0 refills | Status: AC

## 2023-10-17 MED ORDER — SEMAGLUTIDE-WEIGHT MANAGEMENT 0.5 MG/0.5ML ~~LOC~~ SOAJ: 0.5000 mg | SUBCUTANEOUS | 0 refills | Status: AC

## 2023-10-17 NOTE — Patient Instructions (Signed)
 Aim to do some physical activity for 150 minutes per week. This is typically divided into 5 days per week, 30 minutes per day. The activity should be enough to get your heart rate up. Anything is better than nothing if you have time constraints.

## 2023-10-17 NOTE — Assessment & Plan Note (Signed)
Aim to do some physical activity for 150 minutes per week. This is typically divided into 5 days per week, 30 minutes per day. The activity should be enough to get your heart rate up. Anything is better than nothing if you have time constraints.   Continue to work on [employer]

## 2023-10-17 NOTE — Assessment & Plan Note (Signed)
Chronic, well controlled  Currently taking metoprolol 50 mg by mouth TWICE A DAY and lisinopril 10 mg by mouth once daily. Refill sent for Lisinopril

## 2023-10-17 NOTE — Progress Notes (Signed)
Subjective:  Patient ID: Kellie Foster, female    DOB: 07-15-1991  Age: 32 y.o. MRN: [id number]  Chief Complaint  Patient presents with   Annual Exam    HPI  Well Adult Physical: Patient here for a comprehensive physical exam.The patient reports no problems Do you take any herbs or supplements that were not prescribed by a doctor? yes Are you taking calcium supplements? no Are you taking aspirin daily? no  Encounter for general adult medical examination without abnormal findings  Physical ("At Risk" items are starred): Patient's last physical exam was 1 year ago .  Patient is not afflicted from Stress Incontinence and Urge Incontinence  Patient wears a seat belts Patient has smoke detectors and has carbon monoxide detectors. Patient practices appropriate gun safety. Patient wears sunscreen with extended sun exposure. Dental Care: biannual cleanings, brushes and flosses daily. Ophthalmology/Optometry: Annual visit.  Hearing loss: none Vision impairments: none  Menarche: 9 Menstrual History: irregular (3-7 days) LMP: 09/25/23 Pregnancy history: none Safe at home: yes Self breast exams: sometimes  Hypertension: Patient taking lisinopril 10 mg by mouth once daily. Patient tolerating well. Patient denies chest pain or shortness of breath. Patient is requesting a refill.   PCOS: Patient has hirsutism, irregular periods not regulated with birth control pills, and is obese. She states that she wonders if she has PCOS. She has had irregular periods since she was 10-11, she remembers not having her period for over a year when she was 28. She starting having coarse hair on her chin several years ago.   Morbid Obesity: Current BMI 58.05, Patient has had trouble losing weight. Patient currently walks every morning. She tries to eat right, but she has a weakness for chips and all things salty and savory.      10/17/2023    2:08 PM 05/31/2023    1:25 PM 06/24/2022   10:59 AM  Depression  screen PHQ 2/9  Decreased Interest 0 0 0  Down, Depressed, Hopeless 0 0 0  PHQ - 2 Score 0 0 0  Altered sleeping 1  0  Tired, decreased energy 1  0  Change in appetite 1  0  Feeling bad or failure about yourself  0  0  Trouble concentrating 0  0  Moving slowly or fidgety/restless 0  0  Suicidal thoughts 0  0  PHQ-9 Score 3  0  Difficult doing work/chores Not difficult at all  Not difficult at all         06/24/2022   11:00 AM 05/31/2023    1:24 PM 10/17/2023    2:09 PM  Fall Risk  Falls in the past year? 0 0 0  Was there an injury with Fall? 0 0 0  Fall Risk Category Calculator 0 0 0  Fall Risk Category (Retired) Low    (RETIRED) Patient Fall Risk Level Low fall risk    Patient at Risk for Falls Due to No Fall Risks No Fall Risks No Fall Risks  Fall risk Follow up Falls evaluation completed Falls evaluation completed           Social Hx   Social History   Socioeconomic History   Marital status: Single    Spouse name: Not on file   Number of children: Not on file   Years of education: Not on file   Highest education level: Not on file  Occupational History   Not on file  Tobacco Use   Smoking status: Never   Smokeless tobacco:  Never   Tobacco comments:    2nd hand smoking exposure  Substance and Sexual Activity   Alcohol use: No   Drug use: No   Sexual activity: Not on file  Other Topics Concern   Not on file  Social History Narrative   Not on file   Social Determinants of Health   Financial Resource Strain: Low Risk  (05/31/2023)   Overall Financial Resource Strain (CARDIA)    Difficulty of Paying Living Expenses: Not hard at all  Food Insecurity: No Food Insecurity (05/31/2023)   Hunger Vital Sign    Worried About Running Out of Food in the Last Year: Never true    Ran Out of Food in the Last Year: Never true  Transportation Needs: No Transportation Needs (05/31/2023)   PRAPARE - Administrator, Civil Service (Medical): No    Lack of  Transportation (Non-Medical): No  Physical Activity: Sufficiently Active (10/17/2023)   Exercise Vital Sign    Days of Exercise per Week: 7 days    Minutes of Exercise per Session: 30 min  Stress: No Stress Concern Present (05/31/2023)   [employer] of Occupational Health - Occupational Stress Questionnaire    Feeling of Stress : Not at all  Social Connections: Unknown (05/31/2023)   Social Connection and Isolation Panel [NHANES]    Frequency of Communication with Friends and Family: More than three times a week    Frequency of Social Gatherings with Friends and Family: Three times a week    Attends Religious Services: 1 to 4 times per year    Active Member of Clubs or Organizations: No    Attends Banker Meetings: Never    Marital Status: Not on file   Past Medical History:  Diagnosis Date   Hypertension    Obesity    Recurrent UTI (urinary tract infection)    History reviewed. No pertinent surgical history.  Family History  Problem Relation Age of Onset   Hypertension Mother    Diabetes Mother    Kidney cancer Mother    Hypertension Father     Review of Systems  Constitutional:  Negative for chills, diaphoresis, fatigue and fever.  HENT:  Negative for congestion, ear pain, sinus pain and sore throat.   Eyes:  Negative for pain.  Respiratory:  Negative for cough and shortness of breath.   Cardiovascular:  Negative for chest pain.  Gastrointestinal:  Negative for abdominal pain, constipation, diarrhea, nausea and vomiting.  Endocrine: Negative for polyphagia and polyuria.  Genitourinary:  Negative for dysuria, frequency and urgency.  Musculoskeletal:  Negative for arthralgias, back pain and myalgias.  Skin:  Negative for color change and rash.  Neurological:  Negative for weakness and headaches.  Psychiatric/Behavioral:  Negative for dysphoric mood and sleep disturbance. The patient is not nervous/anxious.      Objective:  BP 118/72   Pulse 72   Resp  16   Ht 5\' 4"  (1.626 m)   Wt (!) 338 lb 3.2 oz (153.4 kg)   SpO2 98%   BMI 58.05 kg/m      10/17/2023    2:00 PM 09/07/2023   10:59 AM 05/31/2023    1:23 PM  BP/Weight  Systolic BP 118 120 134  Diastolic BP 72 80 86  Wt. (Lbs) 338.2 334 335.8  BMI 58.05 kg/m2 57.33 kg/m2 57.64 kg/m2    Physical Exam Exam conducted with a chaperone present.  Constitutional:      General: She is not in  acute distress.    Appearance: She is obese. She is not ill-appearing.  HENT:     Head: Normocephalic.     Right Ear: Tympanic membrane normal.     Left Ear: Tympanic membrane normal.     Nose: Nose normal.     Mouth/Throat:     Mouth: Mucous membranes are moist.     Pharynx: No posterior oropharyngeal erythema.  Eyes:     Extraocular Movements: Extraocular movements intact.     Conjunctiva/sclera: Conjunctivae normal.     Pupils: Pupils are equal, round, and reactive to light.  Cardiovascular:     Rate and Rhythm: Normal rate and regular rhythm.     Pulses: Normal pulses.     Heart sounds: Normal heart sounds.  Pulmonary:     Effort: Pulmonary effort is normal.     Breath sounds: Normal breath sounds. No wheezing.  Chest:  Breasts:    Breasts are symmetrical.     Right: Normal.     Left: Normal.  Abdominal:     General: Bowel sounds are normal.     Palpations: Abdomen is soft.     Tenderness: There is no abdominal tenderness.  Genitourinary:    Labia:        Right: No rash.        Left: No rash.      Vagina: Normal.     Cervix: No friability, erythema or cervical bleeding.     Uterus: Normal.      Adnexa: Right adnexa normal and left adnexa normal.     Rectum: Normal. Normal anal tone.     Comments: Pap obtained Skin:    General: Skin is warm.  Neurological:     Mental Status: She is alert.     Cranial Nerves: Cranial nerves 2-12 are intact.     Deep Tendon Reflexes: Reflexes are normal and symmetric.  Psychiatric:        Mood and Affect: Mood normal.        Behavior:  Behavior normal.     Lab Results  Component Value Date   WBC 10.2 07/01/2022   HGB 14.7 07/01/2022   HCT 45.2 07/01/2022   PLT 362 07/01/2022   GLUCOSE 85 06/24/2022   CHOL 162 06/24/2022   TRIG 68 06/24/2022   HDL 50 06/24/2022   LDLCALC 99 06/24/2022   ALT 19 06/24/2022   AST 17 06/24/2022   NA 136 06/24/2022   K 4.7 06/24/2022   CL 99 06/24/2022   CREATININE 0.59 06/24/2022   BUN 10 06/24/2022   CO2 22 06/24/2022   TSH 3.460 06/24/2022   HGBA1C 5.3 06/24/2022      Assessment & Plan:  Annual physical exam Assessment & Plan: Aim to do some physical activity for 150 minutes per week. This is typically divided into 5 days per week, 30 minutes per day. The activity should be enough to get your heart rate up. Anything is better than nothing if you have time constraints.   Continue to work on healthy diet  Orders: -     CBC With Diff/Platelet; Future -     Comprehensive metabolic panel; Future -     TSH; Future -     Lipid panel; Future -     T4, free; Future  Primary hypertension Assessment & Plan: Chronic, well controlled  Currently taking metoprolol 50 mg by mouth TWICE A DAY and lisinopril 10 mg by mouth once daily. Refill sent for Lisinopril   Orders: -  Lisinopril; Take 1 tablet (10 mg total) by mouth daily.  Dispense: 90 tablet; Refill: 3  Morbid obesity with BMI of 50.0-59.9, adult (HCC) Assessment & Plan: Concerned with increased weight gain - currently walking everyday - labs to be drawn tomorrow (CBC. CMP, lipids, TSH, and A1C) - PCOS contributing factor  Orders: -     Hemoglobin A1c; Future -     Semaglutide-Weight Management; Inject 0.25 mg into the skin once a week for 28 days.  Dispense: 2 mL; Refill: 0 -     Semaglutide-Weight Management; Inject 0.5 mg into the skin once a week for 28 days.  Dispense: 2 mL; Refill: 0  PCOS (polycystic ovarian syndrome) Assessment & Plan: According to the [place]ter[place] criteria (preferred) -- Most expert  groups use [place] criteria to make the diagnosis of PCOS. Two out of three of the following criteria are required to make the diagnosis :  ?Oligo- and/or anovulation - patient has had this for several years despite being on birth control ?Clinical and/or biochemical signs of hyperandrogenism - patient has hirsutism and obesity ?Polycystic ovaries (by ultrasound)  - Metformin 500 mg by mouth with breakfast once daily - Wegovy 0.25 mg SQ once weekly for 4 weeks, then increase to 0.5 mg SQ once weekly for 4 weeks FU in 4-6 weeks after starting the injections  Orders: -     metFORMIN HCl; Take 1 tablet (500 mg total) by mouth daily with breakfast.  Dispense: 30 tablet; Refill: 0  Screening for cervical cancer -     IGP, Aptima HPV, rfx 16/18,45     Body mass index is 58.05 kg/m.   These are the goals we discussed:  Goals      Activity and Exercise Increased     Evidence-based guidance:  Review current exercise levels.  Assess patient perspective on exercise or activity level, barriers to increasing activity, motivation and readiness for change.  Recommend or set healthy exercise goal based on individual tolerance.  Encourage small steps toward making change in amount of exercise or activity.  Urge reduction of sedentary activities or screen time.  Promote group activities within the community or with family or support person.  Consider referral to rehabiliation therapist for assessment and exercise/activity plan.   Notes:  Continue to walk every morning     DIET - EAT MORE FRUITS AND VEGETABLES     Encouraged patient to eat more fruits and vegetables          This is a list of the screening recommended for you and due dates:  Health Maintenance  Topic Date Due   HIV Screening  Never done   Pap with HPV screening  11/11/2023   DTaP/Tdap/Td vaccine (8 - Td or Tdap) 08/15/2029   Flu Shot  Completed   HPV Vaccine  Aged Out   COVID-19 Vaccine  Discontinued   Hepatitis  C Screening  Discontinued     Meds ordered this encounter  Medications   metFORMIN (GLUCOPHAGE) 500 MG tablet    Sig: Take 1 tablet (500 mg total) by mouth daily with breakfast.    Dispense:  30 tablet    Refill:  0   lisinopril (ZESTRIL) 10 MG tablet    Sig: Take 1 tablet (10 mg total) by mouth daily.    Dispense:  90 tablet    Refill:  3   Semaglutide-Weight Management 0.25 MG/0.5ML SOAJ    Sig: Inject 0.25 mg into the skin once a week for 28 days.  Dispense:  2 mL    Refill:  0   Semaglutide-Weight Management 0.5 MG/0.5ML SOAJ    Sig: Inject 0.5 mg into the skin once a week for 28 days.    Dispense:  2 mL    Refill:  0    Follow-up: Return in about 1 day (around 10/18/2023) for lab visit, 8 weeks for med check.  An After Visit Summary was printed and given to the patient.  Total time spent on today's visit was greater than 30 minutes, including both face-to-face time and nonface-to-face time personally spent on review of chart (labs and imaging), discussing labs and goals, discussing further work-up, treatment options, referrals to specialist if needed, reviewing outside records if pertinent, answering patient's questions, and coordinating care.   Lajuana Matte, FNP [facility] [phone]

## 2023-10-17 NOTE — Assessment & Plan Note (Signed)
According to the [place] criteria (preferred) -- Most expert groups use [place] criteria to make the diagnosis of PCOS. Two out of three of the following criteria are required to make the diagnosis :  ?Oligo- and/or anovulation - patient has had this for several years despite being on birth control ?Clinical and/or biochemical signs of hyperandrogenism - patient has hirsutism and obesity ?Polycystic ovaries (by ultrasound)  - Metformin 500 mg by mouth with breakfast once daily - Wegovy 0.25 mg SQ once weekly for 4 weeks, then increase to 0.5 mg SQ once weekly for 4 weeks FU in 4-6 weeks after starting the injections

## 2023-10-17 NOTE — Assessment & Plan Note (Signed)
Concerned with increased weight gain - currently walking everyday - labs to be drawn tomorrow (CBC. CMP, lipids, TSH, and A1C) - PCOS contributing factor

## 2023-10-18 ENCOUNTER — Other Ambulatory Visit: Payer: BC Managed Care – PPO

## 2023-10-18 DIAGNOSIS — Z0000 Encounter for general adult medical examination without abnormal findings: Secondary | ICD-10-CM

## 2023-10-18 DIAGNOSIS — Z6841 Body Mass Index (BMI) 40.0 and over, adult: Secondary | ICD-10-CM

## 2023-10-19 LAB — HEMOGLOBIN A1C
Est. average glucose Bld gHb Est-mCnc: 114 mg/dL
Hgb A1c MFr Bld: 5.6 % (ref 4.8–5.6)

## 2023-10-19 LAB — T4, FREE: Free T4: 1.22 ng/dL (ref 0.82–1.77)

## 2023-10-21 LAB — IGP, APTIMA HPV, RFX 16/18,45
HPV Aptima: NEGATIVE
PAP Smear Comment: 0

## 2023-10-24 ENCOUNTER — Ambulatory Visit (INDEPENDENT_AMBULATORY_CARE_PROVIDER_SITE_OTHER): Payer: BC Managed Care – PPO | Admitting: Family Medicine

## 2023-10-24 VITALS — BP 130/88 | HR 70 | Temp 98.0°F | Resp 16 | Ht 64.0 in | Wt 335.0 lb

## 2023-10-24 DIAGNOSIS — I10 Essential (primary) hypertension: Secondary | ICD-10-CM | POA: Diagnosis not present

## 2023-10-24 DIAGNOSIS — M5442 Lumbago with sciatica, left side: Secondary | ICD-10-CM | POA: Insufficient documentation

## 2023-10-24 DIAGNOSIS — E282 Polycystic ovarian syndrome: Secondary | ICD-10-CM | POA: Diagnosis not present

## 2023-10-24 DIAGNOSIS — Z6841 Body Mass Index (BMI) 40.0 and over, adult: Secondary | ICD-10-CM

## 2023-10-24 MED ORDER — KETOROLAC TROMETHAMINE 60 MG/2ML IM SOLN
60.0000 mg | Freq: Once | INTRAMUSCULAR | Status: AC
  Administered 2023-10-24: 60 mg via INTRAMUSCULAR

## 2023-10-24 NOTE — Assessment & Plan Note (Signed)
Acute Persistent despite chiropractic intervention and self-care measures. Numbness in both legs, possibly related to back pain or sciatica. Sharp pain in the neck when turning to the left. -Refer to Terrilee Files, DO for further evaluation and management. -Administer Toradol 60 mg IM injection for pain relief.

## 2023-10-24 NOTE — Progress Notes (Signed)
Acute Office Visit  Subjective:    Patient ID: Kellie Foster, female    DOB: 10/31/1991, 32 y.o.   MRN: [id number]  Chief Complaint  Patient presents with   Back Pain    HPI: Low back pain/sciatica: Patient is in today for lower left side back pain.  This has been ongoing for a few weeks.  [facility].  She complains of bilateral lower extremity numbness and tingling.  She states that the pain is worse in the morning when she gets out of bed.  She complains of neck stiffness when she turns her head to the left. The patient has been trying to manage the pain with walking, stretching, and over-the-counter pain relievers, but these measures have not been effective.   PCOS: The patient also mentions ongoing fatigue related to PCOS. Patient has tolerated the Metformin 500 mg once daily well with good compliance and no side effects other than decreased appetite.   Hypertension: Well controlled. Taking lisinopril 10 mg by mouth daily and lopressor 50 mg by mouth TWICE A DAY daily.  Morbid Obesity:  Patient has lost 3 pounds since last visit. Has not started [facility] yet. She stated that she is hoping that her insurance will approve it.    Past Medical History:  Diagnosis Date   Hypertension    Obesity    Recurrent UTI (urinary tract infection)     History reviewed. No pertinent surgical history.  Family History  Problem Relation Age of Onset   Hypertension Mother    Diabetes Mother    Kidney cancer Mother    Hypertension Father     Social History   Socioeconomic History   Marital status: Single    Spouse name: Not on file   Number of children: Not on file   Years of education: Not on file   Highest education level: Some college, no degree  Occupational History   Not on file  Tobacco Use   Smoking status: Never   Smokeless tobacco: Never   Tobacco comments:    2nd hand smoking exposure  Substance and Sexual Activity   Alcohol use: No   Drug use: No    Sexual activity: Not on file  Other Topics Concern   Not on file  Social History Narrative   Not on file   Social Determinants of Health   Financial Resource Strain: Low Risk  (10/24/2023)   Overall Financial Resource Strain (CARDIA)    Difficulty of Paying Living Expenses: Not hard at all  Food Insecurity: No Food Insecurity (10/24/2023)   Hunger Vital Sign    Worried About Running Out of Food in the Last Year: Never true    Ran Out of Food in the Last Year: Never true  Transportation Needs: No Transportation Needs (10/24/2023)   PRAPARE - Administrator, Civil Service (Medical): No    Lack of Transportation (Non-Medical): No  Physical Activity: Insufficiently Active (10/24/2023)   Exercise Vital Sign    Days of Exercise per Week: 5 days    Minutes of Exercise per Session: 20 min  Stress: No Stress Concern Present (10/24/2023)   [employer] of Occupational Health - Occupational Stress Questionnaire    Feeling of Stress : Not at all  Social Connections: Socially Isolated (10/24/2023)   Social Connection and Isolation Panel [NHANES]    Frequency of Communication with Friends and Family: Once a week    Frequency of Social Gatherings with Friends and Family:  Once a week    Attends Religious Services: Never    Active Member of Clubs or Organizations: No    Attends Banker Meetings: Never    Marital Status: Living with partner  Intimate Partner Violence: Not At Risk (05/31/2023)   Humiliation, Afraid, Rape, and Kick questionnaire    Fear of Current or Ex-Partner: No    Emotionally Abused: No    Physically Abused: No    Sexually Abused: No    Outpatient Medications Prior to Visit  Medication Sig Dispense Refill   cetirizine (ZYRTEC ALLERGY) 10 MG tablet Take 1 tablet (10 mg total) by mouth daily. 30 tablet 3   fluticasone (FLONASE) 50 MCG/ACT nasal spray Place 2 sprays into both nostrils daily. 15.8 mL 3   lisinopril (ZESTRIL) 10 MG tablet Take  1 tablet (10 mg total) by mouth daily. 90 tablet 0   lisinopril (ZESTRIL) 10 MG tablet Take 1 tablet (10 mg total) by mouth daily. 90 tablet 3   meloxicam (MOBIC) 15 MG tablet Take 1 tablet (15 mg total) by mouth daily. 90 tablet 1   metFORMIN (GLUCOPHAGE) 500 MG tablet Take 1 tablet (500 mg total) by mouth daily with breakfast. 30 tablet 0   metoprolol tartrate (LOPRESSOR) 50 MG tablet Take 1 tablet (50 mg total) by mouth 2 (two) times daily. 180 tablet 0   Norgestimate-Ethinyl Estradiol Triphasic (TRI-SPRINTEC) 0.18/0.215/0.25 MG-35 MCG tablet Take 1 tablet by mouth daily. 28 tablet 5   Semaglutide-Weight Management 0.25 MG/0.5ML SOAJ Inject 0.25 mg into the skin once a week for 28 days. 2 mL 0   [START ON 11/15/2023] Semaglutide-Weight Management 0.5 MG/0.5ML SOAJ Inject 0.5 mg into the skin once a week for 28 days. 2 mL 0   No facility-administered medications prior to visit.    Allergies  Allergen Reactions   Ciprofloxacin Hcl Hives   Penicillins Hives    Has patient had a PCN reaction causing immediate rash, facial/tongue/throat swelling, SOB or lightheadedness with hypotension:YES  Has patient had a PCN reaction causing severe rash involving mucus membranes or skin necrosis:No  Has patient had a PCN reaction that required hospitalization NO  Has patient had a PCN reaction occurring within the last 10 years: NO  If all of the above answers are "NO", then may proceed with Cephalosporin use.   Sulfa Antibiotics Rash    Review of Systems  Constitutional:  Positive for fatigue. Negative for chills and fever.  HENT:  Negative for congestion, ear pain and sinus pain.   Respiratory:  Negative for cough and shortness of breath.   Cardiovascular:  Negative for chest pain.  Gastrointestinal:  Negative for abdominal pain, constipation, nausea and vomiting.  Genitourinary:  Negative for dysuria, frequency and urgency.  Musculoskeletal:  Positive for back pain (low back pain). Negative for  arthralgias.  Neurological:  Positive for numbness (bilateral legs). Negative for weakness and headaches.  Psychiatric/Behavioral:  Negative for dysphoric mood. The patient is not nervous/anxious.        Objective:        10/24/2023    3:29 PM 10/24/2023    2:44 PM 10/17/2023    2:00 PM  Vitals with BMI  Height  5\' 4"  5\' 4"   Weight  335 lbs 338 lbs 3 oz  BMI  57.47 58.02  Systolic 130 120 308  Diastolic 88 72 72  Pulse  70 72    Orthostatic VS for the past 72 hrs (Last 3 readings):  Patient Position BP Location  10/24/23 1529 Sitting Right Arm     Physical Exam Constitutional:      General: She is not in acute distress.    Appearance: Normal appearance.  Cardiovascular:     Rate and Rhythm: Normal rate and regular rhythm.     Heart sounds: Normal heart sounds.  Pulmonary:     Effort: Pulmonary effort is normal.     Breath sounds: Normal breath sounds.  Abdominal:     General: Bowel sounds are normal.     Palpations: Abdomen is soft.     Tenderness: There is no abdominal tenderness.  Neurological:     Mental Status: She is alert. Mental status is at baseline.  Psychiatric:        Mood and Affect: Mood normal.        Behavior: Behavior normal.     Health Maintenance Due  Topic Date Due   HIV Screening  Never done    There are no preventive care reminders to display for this patient.   Lab Results  Component Value Date   TSH 3.460 06/24/2022   Lab Results  Component Value Date   WBC 10.2 07/01/2022   HGB 14.7 07/01/2022   HCT 45.2 07/01/2022   MCV 86 07/01/2022   PLT 362 07/01/2022   Lab Results  Component Value Date   NA 136 06/24/2022   K 4.7 06/24/2022   CO2 22 06/24/2022   GLUCOSE 85 06/24/2022   BUN 10 06/24/2022   CREATININE 0.59 06/24/2022   BILITOT 0.5 06/24/2022   ALKPHOS 79 06/24/2022   AST 17 06/24/2022   ALT 19 06/24/2022   PROT 7.8 06/24/2022   ALBUMIN 4.2 06/24/2022   CALCIUM 9.8 06/24/2022   ANIONGAP 13 01/06/2016   EGFR  123 06/24/2022   Lab Results  Component Value Date   CHOL 162 06/24/2022   Lab Results  Component Value Date   HDL 50 06/24/2022   Lab Results  Component Value Date   LDLCALC 99 06/24/2022   Lab Results  Component Value Date   TRIG 68 06/24/2022   Lab Results  Component Value Date   CHOLHDL 3.2 06/24/2022   Lab Results  Component Value Date   HGBA1C 5.6 10/18/2023       Assessment & Plan:  Primary hypertension Assessment & Plan: Labs drawn today Chronic, well controlled -Continue taking metoprolol 50 mg by mouth TWICE A DAY and lisinopril 10 mg by mouth once daily.    Orders: -     CBC With Diff/Platelet -     TSH -     CBC with Differential/Platelet -     Comprehensive metabolic panel  Acute bilateral low back pain with left-sided sciatica Assessment & Plan: Acute Persistent despite chiropractic intervention and self-care measures. Numbness in both legs, possibly related to back pain or sciatica. Sharp pain in the neck when turning to the left. -Refer to Terrilee Files, DO for further evaluation and management. -Administer Toradol 60 mg IM injection for pain relief.    Orders: -     Ketorolac Tromethamine -     Ambulatory referral to Sports Medicine  PCOS (polycystic ovarian syndrome) Assessment & Plan: Patient still fatigued - Metformin 500 mg by mouth with breakfast once daily - Wegovy 0.25 mg SQ once weekly for 4 weeks, then increase to 0.5 mg SQ once weekly for 4 weeks FU in 1 month    Morbid obesity with BMI of 50.0-59.9, adult [facility]) Assessment & Plan: Patient reports decreased appetite  since starting Metformin. [facility] prescription not picked up from the pharmacy. -Ensure [facility] prescription is sent to the pharmacy. -Provide Wegovy samples to patient for use until prescription is approved. -Encourage patient to attend diabetes education group for diet planning and weight management.  Orders: -     TSH -     Lipid panel     Meds ordered  this encounter  Medications   ketorolac (TORADOL) injection 60 mg    Orders Placed This Encounter  Procedures   CBC With Diff/Platelet   TSH   CBC with Differential   Comprehensive metabolic panel   Lipid Panel   Ambulatory referral to Sports Medicine     Follow-up: Return in about 1 month (around 11/23/2023) for med check.  An After Visit Summary was printed and given to the patient.  Total time spent on today's visit was greater than 40 minutes, including both face-to-face time and nonface-to-face time personally spent on review of chart (labs and imaging), discussing labs and goals, discussing further work-up, treatment options, referrals to specialist if needed, reviewing outside records of pertinent, answering patient's questions, and coordinating care.   Lajuana Matte, FNP [facility] [phone]

## 2023-10-24 NOTE — Assessment & Plan Note (Signed)
Patient still fatigued - Metformin 500 mg by mouth with breakfast once daily - Wegovy 0.25 mg SQ once weekly for 4 weeks, then increase to 0.5 mg SQ once weekly for 4 weeks FU in 1 month

## 2023-10-24 NOTE — Assessment & Plan Note (Signed)
Patient reports decreased appetite since starting Metformin. [facility] prescription not picked up from the pharmacy. -Ensure [facility] prescription is sent to the pharmacy. -Provide Wegovy samples to patient for use until prescription is approved. -Encourage patient to attend diabetes education group for diet planning and weight management.

## 2023-10-24 NOTE — Assessment & Plan Note (Addendum)
Labs drawn today Chronic, well controlled -Continue taking metoprolol 50 mg by mouth TWICE A DAY and lisinopril 10 mg by mouth once daily.

## 2023-10-25 ENCOUNTER — Ambulatory Visit: Payer: Self-pay | Admitting: Family Medicine

## 2023-10-25 LAB — CBC WITH DIFFERENTIAL/PLATELET
Basophils Absolute: 0.1 10*3/uL (ref 0.0–0.2)
Basos: 1 %
EOS (ABSOLUTE): 0.2 10*3/uL (ref 0.0–0.4)
Eos: 1 %
Hematocrit: 47.5 % — ABNORMAL HIGH (ref 34.0–46.6)
Hemoglobin: 15 g/dL (ref 11.1–15.9)
Immature Grans (Abs): 0 10*3/uL (ref 0.0–0.1)
Immature Granulocytes: 0 %
Lymphocytes Absolute: 3.3 10*3/uL — ABNORMAL HIGH (ref 0.7–3.1)
Lymphs: 24 %
MCH: 28.2 pg (ref 26.6–33.0)
MCHC: 31.6 g/dL (ref 31.5–35.7)
MCV: 89 fL (ref 79–97)
Monocytes Absolute: 1.1 10*3/uL — ABNORMAL HIGH (ref 0.1–0.9)
Monocytes: 8 %
Neutrophils Absolute: 9.3 10*3/uL — ABNORMAL HIGH (ref 1.4–7.0)
Neutrophils: 66 %
Platelets: 368 10*3/uL (ref 150–450)
RBC: 5.32 x10E6/uL — ABNORMAL HIGH (ref 3.77–5.28)
RDW: 12.1 % (ref 11.7–15.4)
WBC: 14 10*3/uL — ABNORMAL HIGH (ref 3.4–10.8)

## 2023-10-25 LAB — COMPREHENSIVE METABOLIC PANEL
ALT: 27 [IU]/L (ref 0–32)
AST: 25 [IU]/L (ref 0–40)
Albumin: 4.4 g/dL (ref 3.9–4.9)
Alkaline Phosphatase: 70 [IU]/L (ref 44–121)
BUN/Creatinine Ratio: 24 — ABNORMAL HIGH (ref 9–23)
BUN: 19 mg/dL (ref 6–20)
Bilirubin Total: 0.2 mg/dL (ref 0.0–1.2)
CO2: 19 mmol/L — ABNORMAL LOW (ref 20–29)
Calcium: 10.6 mg/dL — ABNORMAL HIGH (ref 8.7–10.2)
Chloride: 101 mmol/L (ref 96–106)
Creatinine, Ser: 0.78 mg/dL (ref 0.57–1.00)
Globulin, Total: 3.2 g/dL (ref 1.5–4.5)
Glucose: 89 mg/dL (ref 70–99)
Potassium: 4.6 mmol/L (ref 3.5–5.2)
Sodium: 142 mmol/L (ref 134–144)
Total Protein: 7.6 g/dL (ref 6.0–8.5)
eGFR: 103 mL/min/{1.73_m2} (ref >59)

## 2023-10-25 LAB — LIPID PANEL
Chol/HDL Ratio: 3.4 ratio (ref 0.0–4.4)
Cholesterol, Total: 173 mg/dL (ref 100–199)
HDL: 51 mg/dL (ref >39)
LDL Chol Calc (NIH): 103 mg/dL — ABNORMAL HIGH (ref 0–99)
Triglycerides: 102 mg/dL (ref 0–149)
VLDL Cholesterol Cal: 19 mg/dL (ref 5–40)

## 2023-10-25 LAB — TSH: TSH: 4.11 u[IU]/mL (ref 0.450–4.500)

## 2023-10-25 NOTE — Telephone Encounter (Signed)
Copied from CRM [id number]. Topic: Clinical - Lab/Test Results >> Oct 25, 2023  8:09 AM Kellie Foster wrote: Reason for CRM: Wants an explanation about results Reason for Disposition  Caller requesting lab results  (Exception: Routine or non-urgent lab result.)  Answer Assessment - Initial Assessment Questions 1. REASON FOR CALL or QUESTION: "What is your reason for calling today?" or "How can I best help you?" or "What question do you have that I can help answer?"     Patient calling about abnormal lab results 2. CALLER: Document the source of call. (e.g., laboratory, patient).     Patient  Protocols used: PCP Call - No Triage-A-AH

## 2023-10-30 ENCOUNTER — Telehealth: Payer: Self-pay

## 2023-10-30 NOTE — Telephone Encounter (Signed)
Patient called stating that her insurance denied wegovy, but she has a girl at work on it and stated her dr. Isidore Foster had to appeal it and write a letter about her having PCOS and she been on metformin and that patient would benefit from wegovy. Wanted to know if we could appeal [place] for wegovy, or is there something else she can try for weight-loss. Please advise

## 2023-11-01 NOTE — Progress Notes (Unsigned)
Aleen Sells D.[facility] [address] [place] [address] Phone: [phone]   Assessment and Plan:    1. Acute bilateral low back pain without sciatica -Acute, initial sports medicine visit - Most consistent with lumbar etiology of pain.  Patient describes radicular sounding symptoms into bilateral lower extremities, however the symptoms have resolved by today's office visit - X-ray obtained in clinic.  My interpretation: No acute fracture or dislocation.  Decreased disc space between L5 and S1 - Start meloxicam 15 mg daily x2 weeks.  If still having pain after 2 weeks, complete 3rd-week of meloxicam. May use remaining meloxicam as needed once daily for pain control.  Do not to use additional NSAIDs while taking meloxicam.  May use Tylenol [phone] mg 2 to 3 times a day for breakthrough pain. - Start HEP for low back  15 additional minutes spent for educating Therapeutic Home Exercise Program.  This included exercises focusing on stretching, strengthening, with focus on eccentric aspects.   Long term goals include an improvement in range of motion, strength, endurance as well as avoiding reinjury. Patient's frequency would include in 1-2 times a day, 3-5 times a week for a duration of 6-12 weeks. Proper technique shown and discussed handout in great detail with ATC.  All questions were discussed and answered.     Pertinent previous records reviewed include none  Follow Up: 4 weeks for reevaluation.  If no improvement or worsening of symptoms, could consider physical therapy versus advanced imaging versus prednisone course   Subjective:   I, Tavis Kring, am serving as a Neurosurgeon for Doctor Richardean Sale  Chief Complaint: low back pain   HPI:   11/02/2023 Patient is a 32 year old female with concerns of low back pain. Patient states she thinks its sciatic nerve pain was told by her chiro to walk it out and she has had some improvement.  Pain for about 2 weeks. She was given a cocktail and that helped for 3 days. Pain does radiate down her legs. No MOI.   Relevant Historical Information: Hypertension  Additional pertinent review of systems negative.   Current Outpatient Medications:    cetirizine (ZYRTEC ALLERGY) 10 MG tablet, Take 1 tablet (10 mg total) by mouth daily., Disp: 30 tablet, Rfl: 3   fluticasone (FLONASE) 50 MCG/ACT nasal spray, Place 2 sprays into both nostrils daily., Disp: 15.8 mL, Rfl: 3   lisinopril (ZESTRIL) 10 MG tablet, Take 1 tablet (10 mg total) by mouth daily., Disp: 90 tablet, Rfl: 0   lisinopril (ZESTRIL) 10 MG tablet, Take 1 tablet (10 mg total) by mouth daily., Disp: 90 tablet, Rfl: 3   meloxicam (MOBIC) 15 MG tablet, Take 1 tablet (15 mg total) by mouth daily., Disp: 90 tablet, Rfl: 1   meloxicam (MOBIC) 15 MG tablet, Take 1 tablet (15 mg total) by mouth daily., Disp: 30 tablet, Rfl: 0   metFORMIN (GLUCOPHAGE) 500 MG tablet, Take 1 tablet (500 mg total) by mouth daily with breakfast., Disp: 30 tablet, Rfl: 0   metoprolol tartrate (LOPRESSOR) 50 MG tablet, Take 1 tablet (50 mg total) by mouth 2 (two) times daily., Disp: 180 tablet, Rfl: 0   Norgestimate-Ethinyl Estradiol Triphasic (TRI-SPRINTEC) 0.18/0.215/0.25 MG-35 MCG tablet, Take 1 tablet by mouth daily., Disp: 28 tablet, Rfl: 5   Semaglutide-Weight Management 0.25 MG/0.5ML SOAJ, Inject 0.25 mg into the skin once a week for 28 days., Disp: 2 mL, Rfl: 0   [START ON 11/15/2023] Semaglutide-Weight Management 0.5  MG/0.5ML SOAJ, Inject 0.5 mg into the skin once a week for 28 days., Disp: 2 mL, Rfl: 0   Objective:     Vitals:   11/02/23 1521  Pulse: 89  SpO2: 100%  Weight: (!) 334 lb (151.5 kg)  Height: 5\' 4"  (1.626 m)      Body mass index is 57.33 kg/m.    Physical Exam:    Gen: Appears well, nad, nontoxic and pleasant Psych: Alert and oriented, appropriate mood and affect Neuro: sensation intact, strength is 5/5 in upper and lower  extremities, muscle tone wnl Skin: no susupicious lesions or rashes  Back - Normal skin, Spine with normal alignment and no deformity.     tenderness to lumbar vertebral process palpation.   Bilateral lumbar paraspinous muscles are   tender and without spasm, worse on the left NTTP gluteal musculature Straight leg raise negative Trendelenberg negative Piriformis Test negative Gait normal    Electronically signed by:  Aleen Sells D.[facility] 3:48 PM 11/02/23

## 2023-11-02 ENCOUNTER — Telehealth: Payer: Self-pay | Admitting: Family Medicine

## 2023-11-02 ENCOUNTER — Ambulatory Visit: Payer: BC Managed Care – PPO

## 2023-11-02 ENCOUNTER — Ambulatory Visit: Payer: BC Managed Care – PPO | Admitting: Sports Medicine

## 2023-11-02 VITALS — HR 89 | Ht 64.0 in | Wt 334.0 lb

## 2023-11-02 DIAGNOSIS — M545 Low back pain, unspecified: Secondary | ICD-10-CM

## 2023-11-02 MED ORDER — MELOXICAM 15 MG PO TABS: 15.0000 mg | ORAL_TABLET | Freq: Every day | ORAL | 0 refills | Status: DC

## 2023-11-02 NOTE — Telephone Encounter (Signed)
After speaking to [facility] pharmacy, patient does not need an appeal. Patient insurance did cover it and 1300 dollars is the price after insurnace, Patient made aware of this and stated she has spoke with pharmacy and they are working on helping her with a discount card.

## 2023-11-02 NOTE — Telephone Encounter (Signed)
PA not required, spoke with [facility] pharmacist who states rx goes through however patient will have to pay $1300.00.

## 2023-11-02 NOTE — Telephone Encounter (Signed)
Patient's insurance approved [id number]. Per note it will cost patient $1300 I talked to patient to see if she was able to go to their website to get the coupon for additional savings and she said she did. She will send me the information and I will contact the pharmacy with the information.

## 2023-11-02 NOTE — Patient Instructions (Addendum)
Low back HEP - Start meloxicam 15 mg daily x2 weeks.  If still having pain after 2 weeks, complete 3rd-week of meloxicam. May use remaining meloxicam as needed once daily for pain control.  Do not to use additional NSAIDs while taking meloxicam.  May use Tylenol [phone] mg 2 to 3 times a day for breakthrough pain. 4 week follow up

## 2023-11-11 ENCOUNTER — Other Ambulatory Visit: Payer: Self-pay | Admitting: Family Medicine

## 2023-11-11 DIAGNOSIS — E282 Polycystic ovarian syndrome: Secondary | ICD-10-CM

## 2023-11-14 ENCOUNTER — Other Ambulatory Visit: Payer: Self-pay | Admitting: Family Medicine

## 2023-11-14 DIAGNOSIS — E282 Polycystic ovarian syndrome: Secondary | ICD-10-CM

## 2023-11-23 ENCOUNTER — Ambulatory Visit: Payer: BC Managed Care – PPO | Admitting: Family Medicine

## 2023-11-23 VITALS — BP 130/100 | HR 78 | Temp 97.1°F | Ht 64.0 in | Wt 340.0 lb

## 2023-11-23 DIAGNOSIS — E66813 Obesity, class 3: Secondary | ICD-10-CM | POA: Diagnosis not present

## 2023-11-23 DIAGNOSIS — E282 Polycystic ovarian syndrome: Secondary | ICD-10-CM

## 2023-11-23 DIAGNOSIS — I10 Essential (primary) hypertension: Secondary | ICD-10-CM | POA: Diagnosis not present

## 2023-11-23 DIAGNOSIS — J309 Allergic rhinitis, unspecified: Secondary | ICD-10-CM | POA: Insufficient documentation

## 2023-11-23 DIAGNOSIS — J3089 Other allergic rhinitis: Secondary | ICD-10-CM | POA: Diagnosis not present

## 2023-11-23 DIAGNOSIS — Z6841 Body Mass Index (BMI) 40.0 and over, adult: Secondary | ICD-10-CM

## 2023-11-23 MED ORDER — MONTELUKAST SODIUM 10 MG PO TABS: 10.0000 mg | ORAL_TABLET | Freq: Every day | ORAL | 3 refills | Status: DC

## 2023-11-23 MED ORDER — TIRZEPATIDE-WEIGHT MANAGEMENT 2.5 MG/0.5ML ~~LOC~~ SOLN: 2.5000 mg | SUBCUTANEOUS | 0 refills | Status: DC

## 2023-11-23 MED ORDER — PHENTERMINE-TOPIRAMATE ER 3.75-23 MG PO CP24: 1.0000 | ORAL_CAPSULE | Freq: Every morning | ORAL | 0 refills | Status: DC

## 2023-11-23 MED ORDER — SPIRONOLACTONE 25 MG PO TABS: 25.0000 mg | ORAL_TABLET | Freq: Every day | ORAL | 3 refills | Status: DC

## 2023-11-23 NOTE — Assessment & Plan Note (Signed)
Elevated blood pressure readings despite current regimen of Lisinopril and Metoprolol. Lisinopril to be discontinued due to potential interaction with new medication, Spironolactone. -Discontinue Lisinopril. -Start Spironolactone 25mg  once daily. -Monitor blood pressure at home and report readings to provider. FU in 2 months

## 2023-11-23 NOTE — Progress Notes (Signed)
Subjective:  Patient ID: Kellie Foster, female    DOB: 08-Nov-1991  Age: 32 y.o. MRN: [id number]  Chief Complaint  Patient presents with   Medical Management of Chronic Issues   Discussed the use of AI scribe software for clinical note transcription with the patient, who gave verbal consent to proceed.    HPI The patient, with a history of hypertension, PCOS, and obesity, presents with a persistent cough and high blood pressure.   Hypertension: The patient's blood pressure has been consistently high, with readings in the 130s to 140s, despite being on lisinopril and metoprolol. The patient takes these medications as prescribed, one in the morning and one at night. Taking lisinopril 10 mg by mouth daily and lopressor 50 mg by mouth TWICE A DAY daily.  PCOS: The patient also mentions ongoing fatigue related to PCOS. Patient has tolerated the Metformin 500 mg once daily well with good compliance and no side effects other than decreased appetite. The patient also has PCOS and experiences symptoms such as facial hair growth. They are not currently on any medication for this condition.    Morbid Obesity:  BMI 58.36. She states that her clothes are looser but the scale states that she gained 6 pounds from her last visit. Has been on [facility] for 5 weeks. They have been on a weekly injection for weight loss for about five weeks but have not noticed any significant changes. The patient reports less craving for sugary and salty foods and has to force themselves to eat breakfast due to the medications.The patient also discusses their ongoing struggle with weight loss. She continues to walk everyday and try to eat as healthy as possible.  Seasonal allergies: The patient reports the cough started last week and is associated with post-nasal drip. Despite taking over-the-counter medications, zyrtec, the cough has not improved and is causing sleep disturbances.Lastly, the patient reports year-round allergies that  have been worsening with age. Despite trying various over-the-counter medications and a neti pot, the patient's symptoms have not improved.       10/17/2023    2:08 PM 05/31/2023    1:25 PM 06/24/2022   10:59 AM  Depression screen PHQ 2/9  Decreased Interest 0 0 0  Down, Depressed, Hopeless 0 0 0  PHQ - 2 Score 0 0 0  Altered sleeping 1  0  Tired, decreased energy 1  0  Change in appetite 1  0  Feeling bad or failure about yourself  0  0  Trouble concentrating 0  0  Moving slowly or fidgety/restless 0  0  Suicidal thoughts 0  0  PHQ-9 Score 3  0  Difficult doing work/chores Not difficult at all  Not difficult at all        10/17/2023    2:09 PM  Fall Risk   Falls in the past year? 0  Number falls in past yr: 0  Injury with Fall? 0  Risk for fall due to : No Fall Risks    Patient Care Team: Renne Crigler, FNP as PCP - General (Family Medicine)   Review of Systems  Constitutional:  Negative for chills, fatigue and fever.  HENT:  Positive for congestion and postnasal drip. Negative for ear pain, rhinorrhea, sinus pressure, sinus pain and sore throat.   Respiratory:  Positive for cough. Negative for shortness of breath.   Cardiovascular:  Negative for chest pain.  Gastrointestinal:  Negative for diarrhea and nausea.  Neurological:  Negative for dizziness and headaches.  Current Outpatient Medications on File Prior to Visit  Medication Sig Dispense Refill   cetirizine (ZYRTEC ALLERGY) 10 MG tablet Take 1 tablet (10 mg total) by mouth daily. 30 tablet 3   fluticasone (FLONASE) 50 MCG/ACT nasal spray Place 2 sprays into both nostrils daily. 15.8 mL 3   lisinopril (ZESTRIL) 10 MG tablet Take 1 tablet (10 mg total) by mouth daily. 90 tablet 0   lisinopril (ZESTRIL) 10 MG tablet Take 1 tablet (10 mg total) by mouth daily. 90 tablet 3   meloxicam (MOBIC) 15 MG tablet Take 1 tablet (15 mg total) by mouth daily. 90 tablet 1   meloxicam (MOBIC) 15 MG tablet Take 1 tablet (15 mg  total) by mouth daily. 30 tablet 0   metFORMIN (GLUCOPHAGE) 500 MG tablet Take 1 tablet by mouth once daily with breakfast 30 tablet 0   metoprolol tartrate (LOPRESSOR) 50 MG tablet Take 1 tablet (50 mg total) by mouth 2 (two) times daily. 180 tablet 0   Norgestimate-Ethinyl Estradiol Triphasic (TRI-SPRINTEC) 0.18/0.215/0.25 MG-35 MCG tablet Take 1 tablet by mouth daily. 28 tablet 5   Semaglutide-Weight Management 0.5 MG/0.5ML SOAJ Inject 0.5 mg into the skin once a week for 28 days. 2 mL 0   No current facility-administered medications on file prior to visit.   Past Medical History:  Diagnosis Date   Hypertension    Obesity    Recurrent UTI (urinary tract infection)    History reviewed. No pertinent surgical history.  Family History  Problem Relation Age of Onset   Hypertension Mother    Diabetes Mother    Kidney cancer Mother    Hypertension Father    Social History   Socioeconomic History   Marital status: Single    Spouse name: Not on file   Number of children: Not on file   Years of education: Not on file   Highest education level: Some college, no degree  Occupational History   Not on file  Tobacco Use   Smoking status: Never   Smokeless tobacco: Never   Tobacco comments:    2nd hand smoking exposure  Substance and Sexual Activity   Alcohol use: No   Drug use: No   Sexual activity: Not on file  Other Topics Concern   Not on file  Social History Narrative   Not on file   Social Drivers of Health   Financial Resource Strain: Low Risk  (11/23/2023)   Overall Financial Resource Strain (CARDIA)    Difficulty of Paying Living Expenses: Not hard at all  Food Insecurity: No Food Insecurity (11/23/2023)   Hunger Vital Sign    Worried About Running Out of Food in the Last Year: Never true    Ran Out of Food in the Last Year: Never true  Transportation Needs: No Transportation Needs (11/23/2023)   PRAPARE - Administrator, Civil Service (Medical): No     Lack of Transportation (Non-Medical): No  Physical Activity: Insufficiently Active (11/23/2023)   Exercise Vital Sign    Days of Exercise per Week: 5 days    Minutes of Exercise per Session: 20 min  Stress: No Stress Concern Present (11/23/2023)   [employer] of Occupational Health - Occupational Stress Questionnaire    Feeling of Stress : Not at all  Social Connections: Socially Isolated (11/23/2023)   Social Connection and Isolation Panel [NHANES]    Frequency of Communication with Friends and Family: Once a week    Frequency of Social Gatherings with Friends  and Family: Once a week    Attends Religious Services: Never    Active Member of Clubs or Organizations: No    Attends Engineer, structural: Never    Marital Status: Living with partner    Objective:  BP (!) 130/100 (BP Location: Right Arm, Patient Position: Sitting, Cuff Size: Large)   Pulse 78   Temp (!) 97.1 F (36.2 C)   Ht 5\' 4"  (1.626 m)   Wt (!) 340 lb (154.2 kg)   SpO2 98%   BMI 58.36 kg/m      11/23/2023    9:22 AM 11/23/2023    8:42 AM 11/02/2023    3:21 PM  BP/Weight  Systolic BP 130 146   Diastolic BP 100 100   Wt. (Lbs)  340 334  BMI  58.36 kg/m2 57.33 kg/m2    Physical Exam Constitutional:      General: She is not in acute distress.    Appearance: Normal appearance. She is obese. She is not ill-appearing.  HENT:     Head: Normocephalic.     Nose:     Right Turbinates: Pale.     Left Turbinates: Pale.  Eyes:     Conjunctiva/sclera: Conjunctivae normal.  Cardiovascular:     Rate and Rhythm: Normal rate and regular rhythm.     Heart sounds: Normal heart sounds.  Pulmonary:     Effort: Pulmonary effort is normal.     Breath sounds: Normal breath sounds. No wheezing.  Skin:    General: Skin is warm.  Neurological:     Mental Status: She is alert. Mental status is at baseline.  Psychiatric:        Mood and Affect: Mood normal.        Behavior: Behavior normal.     Lab  Results  Component Value Date   WBC 14.0 (H) 10/24/2023   HGB 15.0 10/24/2023   HCT 47.5 (H) 10/24/2023   PLT 368 10/24/2023   GLUCOSE 89 10/24/2023   CHOL 173 10/24/2023   TRIG 102 10/24/2023   HDL 51 10/24/2023   LDLCALC 103 (H) 10/24/2023   ALT 27 10/24/2023   AST 25 10/24/2023   NA 142 10/24/2023   K 4.6 10/24/2023   CL 101 10/24/2023   CREATININE 0.78 10/24/2023   BUN 19 10/24/2023   CO2 19 (L) 10/24/2023   TSH 4.110 10/24/2023   HGBA1C 5.6 10/18/2023      Assessment & Plan:    Primary hypertension Assessment & Plan: Elevated blood pressure readings despite current regimen of Lisinopril and Metoprolol. Lisinopril to be discontinued due to potential interaction with new medication, Spironolactone. -Discontinue Lisinopril. -Start Spironolactone 25mg  once daily. -Monitor blood pressure at home and report readings to provider. FU in 2 months     PCOS (polycystic ovarian syndrome) Assessment & Plan: Currently managed with Metformin. Discussed addition of Spironolactone for potential benefits on blood pressure and hirsutism. - Metformin 500 mg by mouth with breakfast once daily - Start Spironolactone 25mg  once daily. FU in 2 months  Orders: -     Spironolactone; Take 1 tablet (25 mg total) by mouth daily.  Dispense: 90 tablet; Refill: 3  Seasonal allergic rhinitis due to other allergic trigger Assessment & Plan: Chronic symptoms, not well controlled with current regimen of cetirizine and nasal spray. -Add Singulair 10mg  at bedtime.  Orders: -     Montelukast Sodium; Take 1 tablet (10 mg total) by mouth at bedtime.  Dispense: 30 tablet; Refill: 3  Class 3 severe obesity with serious comorbidity and body mass index (BMI) of 50.0 to 59.9 in adult, unspecified obesity type [facility]) Assessment & Plan: - She has taken Wegovy 0.25 mg SQ once weekly for 5 weeks - unfortunately with insurance it is over $1000 which is unaffordable. Patient interested in trying another  weight loss medication -Prescribe Phentermine-Topiramate 3.75-23 MG, starting at low dose. -Sent Zepbound to pharmacy to see if it gets approved by insurance.   Orders: -     Phentermine-Topiramate; Take 1 capsule by mouth every morning for 30 doses.  Dispense: 30 capsule; Refill: 0 -     Amb Referral to Nutrition and Diabetic Education -     Tirzepatide-Weight Management; Inject 2.5 mg into the skin once a week.  Dispense: 2 mL; Refill: 0     Meds ordered this encounter  Medications   Phentermine-Topiramate 3.75-23 MG CP24    Sig: Take 1 capsule by mouth every morning for 30 doses.    Dispense:  30 capsule    Refill:  0   spironolactone (ALDACTONE) 25 MG tablet    Sig: Take 1 tablet (25 mg total) by mouth daily.    Dispense:  90 tablet    Refill:  3   montelukast (SINGULAIR) 10 MG tablet    Sig: Take 1 tablet (10 mg total) by mouth at bedtime.    Dispense:  30 tablet    Refill:  3   tirzepatide (ZEPBOUND) 2.5 MG/0.5ML injection vial    Sig: Inject 2.5 mg into the skin once a week.    Dispense:  2 mL    Refill:  0    Orders Placed This Encounter  Procedures   Ambulatory referral to Nutrition and Diabetic Education     Follow-up: Return in about 2 months (around 01/24/2024) for chronic.   I,Katherina A Bramblett,acting as a scribe for Renne Crigler, FNP.,have documented all relevant documentation on the behalf of Renne Crigler, FNP,as directed by  Renne Crigler, FNP while in the presence of Renne Crigler, FNP.   An After Visit Summary was printed and given to the patient.  Total time spent on today's visit was 47 minutes, including both face-to-face time and nonface-to-face time personally spent on review of chart (labs and imaging), discussing labs and goals, discussing further work-up, treatment options, referrals to specialist if needed, reviewing outside records if pertinent, answering patient's questions, and coordinating care.    Lajuana Matte, FNP [facility] [phone]

## 2023-11-23 NOTE — Assessment & Plan Note (Signed)
Chronic symptoms, not well controlled with current regimen of cetirizine and nasal spray. -Add Singulair 10mg  at bedtime.

## 2023-11-23 NOTE — Assessment & Plan Note (Addendum)
-   She has taken Wegovy 0.25 mg SQ once weekly for 5 weeks - unfortunately with insurance it is over $1000 which is unaffordable. Patient interested in trying another weight loss medication -Prescribe Phentermine-Topiramate 3.75-23 MG, starting at low dose. -Sent Zepbound to pharmacy to see if it gets approved by insurance.  -Referral to Nutrition / Prediabetes Education

## 2023-11-23 NOTE — Assessment & Plan Note (Addendum)
Currently managed with Metformin. Discussed addition of Spironolactone for potential benefits on blood pressure and hirsutism. - Metformin 500 mg by mouth with breakfast once daily - Start Spironolactone 25mg  once daily. FU in 2 months

## 2023-11-29 NOTE — Progress Notes (Deleted)
    Aleen Sells D.[facility] [address] [place] [address] Phone: [phone]   Assessment and Plan:     There are no diagnoses linked to this encounter.  ***   Pertinent previous records reviewed include ***    Follow Up: ***     Subjective:   I, Kellie Foster, am serving as a Neurosurgeon for Doctor Richardean Sale   Chief Complaint: low back pain    HPI:    11/02/2023 Patient is a 32 year old female with concerns of low back pain. Patient states she thinks its sciatic nerve pain was told by her chiro to walk it out and she has had some improvement. Pain for about 2 weeks. She was given a cocktail and that helped for 3 days. Pain does radiate down her legs. No MOI.   11/30/2023 Patient states   Relevant Historical Information: Hypertension  Additional pertinent review of systems negative.   Current Outpatient Medications:    cetirizine (ZYRTEC ALLERGY) 10 MG tablet, Take 1 tablet (10 mg total) by mouth daily., Disp: 30 tablet, Rfl: 3   fluticasone (FLONASE) 50 MCG/ACT nasal spray, Place 2 sprays into both nostrils daily., Disp: 15.8 mL, Rfl: 3   lisinopril (ZESTRIL) 10 MG tablet, Take 1 tablet (10 mg total) by mouth daily., Disp: 90 tablet, Rfl: 0   lisinopril (ZESTRIL) 10 MG tablet, Take 1 tablet (10 mg total) by mouth daily., Disp: 90 tablet, Rfl: 3   meloxicam (MOBIC) 15 MG tablet, Take 1 tablet (15 mg total) by mouth daily., Disp: 90 tablet, Rfl: 1   meloxicam (MOBIC) 15 MG tablet, Take 1 tablet (15 mg total) by mouth daily., Disp: 30 tablet, Rfl: 0   metFORMIN (GLUCOPHAGE) 500 MG tablet, Take 1 tablet by mouth once daily with breakfast, Disp: 30 tablet, Rfl: 0   metoprolol tartrate (LOPRESSOR) 50 MG tablet, Take 1 tablet (50 mg total) by mouth 2 (two) times daily., Disp: 180 tablet, Rfl: 0   montelukast (SINGULAIR) 10 MG tablet, Take 1 tablet (10 mg total) by mouth at bedtime., Disp: 30 tablet, Rfl: 3   Norgestimate-Ethinyl  Estradiol Triphasic (TRI-SPRINTEC) 0.18/0.215/0.25 MG-35 MCG tablet, Take 1 tablet by mouth daily., Disp: 28 tablet, Rfl: 5   Phentermine-Topiramate 3.75-23 MG CP24, Take 1 capsule by mouth every morning for 30 doses., Disp: 30 capsule, Rfl: 0   Semaglutide-Weight Management 0.5 MG/0.5ML SOAJ, Inject 0.5 mg into the skin once a week for 28 days., Disp: 2 mL, Rfl: 0   spironolactone (ALDACTONE) 25 MG tablet, Take 1 tablet (25 mg total) by mouth daily., Disp: 90 tablet, Rfl: 3   tirzepatide (ZEPBOUND) 2.5 MG/0.5ML injection vial, Inject 2.5 mg into the skin once a week., Disp: 2 mL, Rfl: 0   Objective:     There were no vitals filed for this visit.    There is no height or weight on file to calculate BMI.    Physical Exam:    ***   Electronically signed by:  Aleen Sells D.[facility] 7:50 AM 11/29/23

## 2023-11-30 ENCOUNTER — Ambulatory Visit: Payer: BC Managed Care – PPO | Admitting: Sports Medicine

## 2023-12-22 ENCOUNTER — Ambulatory Visit: Payer: BC Managed Care – PPO | Admitting: Family Medicine

## 2024-01-10 ENCOUNTER — Other Ambulatory Visit: Payer: Self-pay | Admitting: Family Medicine

## 2024-01-24 ENCOUNTER — Ambulatory Visit: Payer: 59 | Admitting: Family Medicine

## 2024-01-24 ENCOUNTER — Encounter: Payer: Self-pay | Admitting: Family Medicine

## 2024-01-24 VITALS — BP 128/72 | HR 72 | Temp 97.2°F | Ht 64.0 in | Wt 333.0 lb

## 2024-01-24 DIAGNOSIS — E282 Polycystic ovarian syndrome: Secondary | ICD-10-CM

## 2024-01-24 DIAGNOSIS — E7800 Pure hypercholesterolemia, unspecified: Secondary | ICD-10-CM | POA: Insufficient documentation

## 2024-01-24 DIAGNOSIS — Z6841 Body Mass Index (BMI) 40.0 and over, adult: Secondary | ICD-10-CM

## 2024-01-24 DIAGNOSIS — J3089 Other allergic rhinitis: Secondary | ICD-10-CM

## 2024-01-24 DIAGNOSIS — I10 Essential (primary) hypertension: Secondary | ICD-10-CM | POA: Diagnosis not present

## 2024-01-24 MED ORDER — QSYMIA 11.25-69 MG PO CP24: 11.2500 mg | ORAL_CAPSULE | Freq: Every day | ORAL | 0 refills | Status: DC

## 2024-01-24 MED ORDER — METOPROLOL TARTRATE 50 MG PO TABS: 50.0000 mg | ORAL_TABLET | Freq: Two times a day (BID) | ORAL | 0 refills | Status: DC

## 2024-01-24 NOTE — Assessment & Plan Note (Signed)
Well controlled with Lisinopril and Metoprolol. BP Readings from Last 3 Encounters:  01/24/24 128/72  11/23/23 (!) 130/100  10/24/23 130/88    - Continue current medications. - Labs drawn today, Await labs/testing for assessment and recommendations

## 2024-01-24 NOTE — Assessment & Plan Note (Signed)
Not at goal, BMI 57.16 Noted weight loss of 7 pounds since last visit. Currently on Qsymia 3.75mg . -Increase Qsymia to 7.5mg  for 30 days. -If weight loss is less than 3% after 12 weeks on 7.5mg , increase to 11.25mg  for 14 days and then to 15mg . -Check in via MyChart to confirm dose increase to 15mg . - change to whole grain breads, cereal, pasta - set goal weight - drink 6 to 8 glasses of water each day - eat fish at least once per week - fill half of plate with vegetables - join a weight loss program - keep a food diary - limit fast food meals to no more than 1 per week - manage portion size - prepare main meal at home 3 to 5 days each week - read food labels for fat, fiber, carbohydrates and portion size - reduce red meat to 2 to 3 times a week - set a realistic goal - switch to low-fat or skim milk - switch to sugar-free drinks exercise 5-6 days a week Going a step further _ Find a healthy go-to snack that is low in carbs, sugar and fat _ Increase servings of fruit _ Increase servings of vegetables _ Reduce soda _ Limit processed foods _ consult a dietician about meal planning

## 2024-01-24 NOTE — Assessment & Plan Note (Signed)
Labs drawn, Await labs/testing for assessment and recommendations  Lab Results  Component Value Date   LDLCALC 103 (H) 10/24/2023  - continue to work on diet and exercise

## 2024-01-24 NOTE — Progress Notes (Signed)
Subjective:  Patient ID: Kellie Foster, female    DOB: [birth year]  Age: 33 y.o. MRN: [id number]  Chief Complaint  Patient presents with   Medical Management of Chronic Issues   Discussed the use of AI scribe software for clinical note transcription with the patient, who gave verbal consent to proceed.   HPI Kellie Foster is a 33 year old female with hypertension and obesity who presents for weight management and medication review.  She is focused on weight management and has been taking Qsymia (phentermine/topiramate) for this purpose. She started at a dose of 3.75 mg and plans to increase to 7.5 mg. She has lost seven pounds since her last visit, with her weight decreasing from 340 pounds in December to 333 pounds currently, and her BMI has decreased from 58.36 to 57. She mentions that when she was receiving weight loss injections, she experienced a decreased appetite the day after the injection, particularly in the mornings, but this effect would subside by the afternoon.  She denies being sick and reports no gastrointestinal symptoms such as nausea, vomiting, constipation, or changes in appetite aside from the effects noted with the weight loss injections.  She is preparing for her wedding in October and is motivated to lose weight before the event. 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  Hypertension: For hypertension, she is on lisinopril, metoprolol, and spironolactone. She notes no noticeable difference with spironolactone but continues to take metoprolol for blood pressure management. Her blood pressure is well-controlled at 128/72 mmHg. No new side effects or symptoms such as headaches, sleep changes, anxiety, or rashes.   Morbid Obesity:  BMI 57.16. She has lost 7 pounds since her last visit. She continues to walk everyday and try to eat as healthy as possible.  Seasonal allergies: She takes Zyrtec, Flonase, and  Singulair for allergies, which have been bothersome due to fluctuating weather conditions, causing sinus congestion and coughing.      01/24/2024    8:58 AM 10/17/2023    2:08 PM 05/31/2023    1:25 PM 06/24/2022   10:59 AM  Depression screen PHQ 2/9  Decreased Interest 0 0 0 0  Down, Depressed, Hopeless 0 0 0 0  PHQ - 2 Score 0 0 0 0  Altered sleeping 0 1  0  Tired, decreased energy 0 1  0  Change in appetite 0 1  0  Feeling bad or failure about yourself  0 0  0  Trouble concentrating 0 0  0  Moving slowly or fidgety/restless 0 0  0  Suicidal thoughts 0 0  0  PHQ-9 Score 0 3  0  Difficult doing work/chores Not difficult at all Not difficult at all  Not difficult at all        01/24/2024    8:58 AM  Fall Risk   Falls in the past year? 0  Number falls in past yr: 0  Injury with Fall? 0  Risk for fall due to : No Fall Risks  Follow up Falls evaluation completed    Patient Care Team: Renne Crigler, FNP as PCP - General (Family Medicine)   Review of Systems  Constitutional:  Positive for appetite change. Negative for fatigue and fever.  HENT:  Negative for congestion, ear pain, sinus pressure and sore throat.   Respiratory:  Negative for cough, chest tightness, shortness of breath and wheezing.   Cardiovascular:  Negative for chest pain  and palpitations.  Gastrointestinal:  Negative for abdominal pain, constipation, diarrhea, nausea and vomiting.  Genitourinary:  Negative for dysuria and hematuria.  Musculoskeletal:  Negative for arthralgias, back pain, joint swelling and myalgias.  Skin:  Negative for rash.  Neurological:  Negative for dizziness, weakness and headaches.  Psychiatric/Behavioral:  Negative for dysphoric mood and sleep disturbance. The patient is not nervous/anxious.     Current Outpatient Medications on File Prior to Visit  Medication Sig Dispense Refill   cetirizine (ZYRTEC ALLERGY) 10 MG tablet Take 1 tablet (10 mg total) by mouth daily. 30 tablet 3    fluticasone (FLONASE) 50 MCG/ACT nasal spray Place 2 sprays into both nostrils daily. 15.8 mL 3   meloxicam (MOBIC) 15 MG tablet Take 1 tablet (15 mg total) by mouth daily. 30 tablet 0   metFORMIN (GLUCOPHAGE) 500 MG tablet Take 1 tablet by mouth once daily with breakfast 30 tablet 0   montelukast (SINGULAIR) 10 MG tablet Take 1 tablet (10 mg total) by mouth at bedtime. 30 tablet 3   Norgestimate-Ethinyl Estradiol Triphasic (TRI-SPRINTEC) 0.18/0.215/0.25 MG-35 MCG tablet Take 1 tablet by mouth daily. 28 tablet 5   spironolactone (ALDACTONE) 25 MG tablet Take 1 tablet (25 mg total) by mouth daily. 90 tablet 3   No current facility-administered medications on file prior to visit.   Past Medical History:  Diagnosis Date   Hypertension    Obesity    Recurrent UTI (urinary tract infection)    History reviewed. No pertinent surgical history.  Family History  Problem Relation Age of Onset   Hypertension Mother    Diabetes Mother    Kidney cancer Mother    Hypertension Father    Social History   Socioeconomic History   Marital status: Single    Spouse name: Not on file   Number of children: Not on file   Years of education: Not on file   Highest education level: Some college, no degree  Occupational History   Not on file  Tobacco Use   Smoking status: Never   Smokeless tobacco: Never   Tobacco comments:    2nd hand smoking exposure  Substance and Sexual Activity   Alcohol use: No   Drug use: No   Sexual activity: Not on file  Other Topics Concern   Not on file  Social History Narrative   Not on file   Social Drivers of Health   Financial Resource Strain: Low Risk  (11/23/2023)   Overall Financial Resource Strain (CARDIA)    Difficulty of Paying Living Expenses: Not hard at all  Food Insecurity: No Food Insecurity (11/23/2023)   Hunger Vital Sign    Worried About Running Out of Food in the Last Year: Never true    Ran Out of Food in the Last Year: Never true   Transportation Needs: No Transportation Needs (11/23/2023)   PRAPARE - Administrator, Civil Service (Medical): No    Lack of Transportation (Non-Medical): No  Physical Activity: Insufficiently Active (11/23/2023)   Exercise Vital Sign    Days of Exercise per Week: 5 days    Minutes of Exercise per Session: 20 min  Stress: No Stress Concern Present (11/23/2023)   [employer] of Occupational Health - Occupational Stress Questionnaire    Feeling of Stress : Not at all  Social Connections: Socially Isolated (11/23/2023)   Social Connection and Isolation Panel [NHANES]    Frequency of Communication with Friends and Family: Once a week    Frequency  of Social Gatherings with Friends and Family: Once a week    Attends Religious Services: Never    Database administrator or Organizations: No    Attends Engineer, structural: Never    Marital Status: Living with partner   WEIGHT MANAGEMENT Do you ever feel like your eating patterns can get out of control?  Do you eat between meals?  Do you eat as a response to your emotions?No Do you have any dietary restrictions? No Do you currently take place in physical activity? Yes  Have  you ever been diagnosed with any of the following? Diabetes - no         Primary Hypertension                             HYPERLIPIDEMIA  What are your weight/obesity management goals? Loss 5 pounds each month  How many serious weight-loss attempts have you made in the past 5 years. one  Did you participate in any structured weight-loss programs in the past and, if so, which ones? NA  Was there one program that seemed to work best for you? NA  What are some barriers that have kept you from losing weight and maintaining weight loss in the past? Nutritional choices and lack of time for exercise.  Have you ever been on an anti-obesity or weight-loss medication in the past or are you currently on one?  Yes   Objective:  BP 128/72 (BP  Location: Left Arm, Patient Position: Sitting)   Pulse 72   Temp (!) 97.2 F (36.2 C) (Temporal)   Ht 5\' 4"  (1.626 m)   Wt (!) 333 lb (151 kg)   SpO2 99%   BMI 57.16 kg/m      01/24/2024    8:48 AM 11/23/2023    9:22 AM 11/23/2023    8:42 AM  BP/Weight  Systolic BP 128 130 146  Diastolic BP 72 100 100  Wt. (Lbs) 333  340  BMI 57.16 kg/m2  58.36 kg/m2    Physical Exam Vitals reviewed.  Constitutional:      General: She is not in acute distress.    Appearance: Normal appearance. She is not ill-appearing.  Eyes:     Conjunctiva/sclera: Conjunctivae normal.  Neck:     Vascular: No carotid bruit.  Cardiovascular:     Rate and Rhythm: Normal rate and regular rhythm.     Heart sounds: Normal heart sounds. No murmur heard. Pulmonary:     Effort: Pulmonary effort is normal.     Breath sounds: Normal breath sounds. No wheezing.  Musculoskeletal:        General: Normal range of motion.  Skin:    General: Skin is warm.  Neurological:     Mental Status: She is alert. Mental status is at baseline.  Psychiatric:        Mood and Affect: Mood normal.        Behavior: Behavior normal.    Lab Results  Component Value Date   WBC 14.0 (H) 10/24/2023   HGB 15.0 10/24/2023   HCT 47.5 (H) 10/24/2023   PLT 368 10/24/2023   GLUCOSE 89 10/24/2023   CHOL 173 10/24/2023   TRIG 102 10/24/2023   HDL 51 10/24/2023   LDLCALC 103 (H) 10/24/2023   ALT 27 10/24/2023   AST 25 10/24/2023   NA 142 10/24/2023   K 4.6 10/24/2023   CL 101 10/24/2023   CREATININE  0.78 10/24/2023   BUN 19 10/24/2023   CO2 19 (L) 10/24/2023   TSH 4.110 10/24/2023   HGBA1C 5.6 10/18/2023      Assessment & Plan:    Primary hypertension Assessment & Plan: Well controlled with Lisinopril and Metoprolol. BP Readings from Last 3 Encounters:  01/24/24 128/72  11/23/23 (!) 130/100  10/24/23 130/88    - Continue current medications. - Labs drawn today, Await labs/testing for assessment and  recommendations   Orders: -     CBC with Differential/Platelet -     Comprehensive metabolic panel -     Metoprolol Tartrate; Take 1 tablet (50 mg total) by mouth 2 (two) times daily.  Dispense: 180 tablet; Refill: 0  Elevated LDL cholesterol level Assessment & Plan: Labs drawn, Await labs/testing for assessment and recommendations  Lab Results  Component Value Date   LDLCALC 103 (H) 10/24/2023  - continue to work on diet and exercise   Orders: -     Lipid panel  Morbid obesity with BMI of 50.0-59.9, adult (HCC) Assessment & Plan: Not at goal, BMI 57.16 Noted weight loss of 7 pounds since last visit. Currently on Qsymia 3.75mg . -Increase Qsymia to 7.5mg  for 30 days. -If weight loss is less than 3% after 12 weeks on 7.5mg , increase to 11.25mg  for 14 days and then to 15mg . -Check in via MyChart to confirm dose increase to 15mg . - change to whole grain breads, cereal, pasta - set goal weight - drink 6 to 8 glasses of water each day - eat fish at least once per week - fill half of plate with vegetables - join a weight loss program - keep a food diary - limit fast food meals to no more than 1 per week - manage portion size - prepare main meal at home 3 to 5 days each week - read food labels for fat, fiber, carbohydrates and portion size - reduce red meat to 2 to 3 times a week - set a realistic goal - switch to low-fat or skim milk - switch to sugar-free drinks exercise 5-6 days a week Going a step further _ Find a healthy go-to snack that is low in carbs, sugar and fat _ Increase servings of fruit _ Increase servings of vegetables _ Reduce soda _ Limit processed foods _ consult a dietician about meal planning   Orders: -     Qsymia; Take 11.25 mg by mouth daily.  Dispense: 30 capsule; Refill: 0  PCOS (polycystic ovarian syndrome) Assessment & Plan: Stable  On metformin 500 mg and spironolactone 25 mg  Labs drawn today    Seasonal allergic rhinitis due to  other allergic trigger Assessment & Plan: Stable Continue medications as prescribed (Flonase, Cetirizine and Singulair)      Meds ordered this encounter  Medications   Phentermine-Topiramate (QSYMIA) 11.25-69 MG CP24    Sig: Take 11.25 mg by mouth daily.    Dispense:  30 capsule    Refill:  0   metoprolol tartrate (LOPRESSOR) 50 MG tablet    Sig: Take 1 tablet (50 mg total) by mouth 2 (two) times daily.    Dispense:  180 tablet    Refill:  0    Orders Placed This Encounter  Procedures   CBC with Differential   Comprehensive metabolic panel   Lipid Panel     Follow-up: Return in about 4 months (around 05/23/2024).  An After Visit Summary was printed and given to the patient.  Total time spent on today's  visit was 40 minutes, including both face-to-face time and nonface-to-face time personally spent on review of chart (labs and imaging), discussing labs and goals, discussing further work-up, treatment options, referrals to specialist if needed, reviewing outside records if pertinent, answering patient's questions, and coordinating care.    Lajuana Matte, FNP [facility] [phone]

## 2024-01-24 NOTE — Assessment & Plan Note (Signed)
Stable  On metformin 500 mg and spironolactone 25 mg  Labs drawn today

## 2024-01-24 NOTE — Assessment & Plan Note (Signed)
Stable Continue medications as prescribed (Flonase, Cetirizine and Singulair)

## 2024-01-25 LAB — COMPREHENSIVE METABOLIC PANEL
ALT: 26 [IU]/L (ref 0–32)
AST: 20 [IU]/L (ref 0–40)
Albumin: 4.4 g/dL (ref 3.9–4.9)
Alkaline Phosphatase: 72 [IU]/L (ref 44–121)
BUN/Creatinine Ratio: 24 — ABNORMAL HIGH (ref 9–23)
BUN: 17 mg/dL (ref 6–20)
Bilirubin Total: 0.4 mg/dL (ref 0.0–1.2)
CO2: 21 mmol/L (ref 20–29)
Calcium: 9.7 mg/dL (ref 8.7–10.2)
Chloride: 102 mmol/L (ref 96–106)
Creatinine, Ser: 0.71 mg/dL (ref 0.57–1.00)
Globulin, Total: 3.1 g/dL (ref 1.5–4.5)
Glucose: 91 mg/dL (ref 70–99)
Potassium: 4.6 mmol/L (ref 3.5–5.2)
Sodium: 140 mmol/L (ref 134–144)
Total Protein: 7.5 g/dL (ref 6.0–8.5)
eGFR: 115 mL/min/{1.73_m2} (ref >59)

## 2024-01-25 LAB — LIPID PANEL
Chol/HDL Ratio: 3.2 {ratio} (ref 0.0–4.4)
Cholesterol, Total: 145 mg/dL (ref 100–199)
HDL: 46 mg/dL (ref >39)
LDL Chol Calc (NIH): 84 mg/dL (ref 0–99)
Triglycerides: 78 mg/dL (ref 0–149)
VLDL Cholesterol Cal: 15 mg/dL (ref 5–40)

## 2024-01-25 LAB — CBC WITH DIFFERENTIAL/PLATELET
Basophils Absolute: 0.1 10*3/uL (ref 0.0–0.2)
Basos: 1 %
EOS (ABSOLUTE): 0.3 10*3/uL (ref 0.0–0.4)
Eos: 2 %
Hematocrit: 45.4 % (ref 34.0–46.6)
Hemoglobin: 14.9 g/dL (ref 11.1–15.9)
Immature Grans (Abs): 0 10*3/uL (ref 0.0–0.1)
Immature Granulocytes: 0 %
Lymphocytes Absolute: 3.4 10*3/uL — ABNORMAL HIGH (ref 0.7–3.1)
Lymphs: 23 %
MCH: 29.3 pg (ref 26.6–33.0)
MCHC: 32.8 g/dL (ref 31.5–35.7)
MCV: 89 fL (ref 79–97)
Monocytes Absolute: 1.2 10*3/uL — ABNORMAL HIGH (ref 0.1–0.9)
Monocytes: 8 %
Neutrophils Absolute: 10 10*3/uL — ABNORMAL HIGH (ref 1.4–7.0)
Neutrophils: 66 %
Platelets: 396 10*3/uL (ref 150–450)
RBC: 5.09 x10E6/uL (ref 3.77–5.28)
RDW: 13 % (ref 11.7–15.4)
WBC: 15.1 10*3/uL — ABNORMAL HIGH (ref 3.4–10.8)

## 2024-01-31 ENCOUNTER — Ambulatory Visit: Payer: 59 | Admitting: Family Medicine

## 2024-01-31 ENCOUNTER — Encounter: Payer: Self-pay | Admitting: Family Medicine

## 2024-01-31 VITALS — BP 128/82 | HR 76 | Temp 97.9°F | Ht 64.0 in | Wt 333.4 lb

## 2024-01-31 DIAGNOSIS — R109 Unspecified abdominal pain: Secondary | ICD-10-CM | POA: Diagnosis not present

## 2024-01-31 DIAGNOSIS — M5440 Lumbago with sciatica, unspecified side: Secondary | ICD-10-CM | POA: Insufficient documentation

## 2024-01-31 DIAGNOSIS — R1084 Generalized abdominal pain: Secondary | ICD-10-CM | POA: Insufficient documentation

## 2024-01-31 DIAGNOSIS — M545 Low back pain, unspecified: Secondary | ICD-10-CM | POA: Insufficient documentation

## 2024-01-31 DIAGNOSIS — E66813 Obesity, class 3: Secondary | ICD-10-CM

## 2024-01-31 DIAGNOSIS — E661 Drug-induced obesity: Secondary | ICD-10-CM

## 2024-01-31 DIAGNOSIS — Z6841 Body Mass Index (BMI) 40.0 and over, adult: Secondary | ICD-10-CM

## 2024-01-31 LAB — POCT URINALYSIS DIP (CLINITEK)
Bilirubin, UA: NEGATIVE
Blood, UA: NEGATIVE
Glucose, UA: NEGATIVE mg/dL
Ketones, POC UA: NEGATIVE mg/dL
Leukocytes, UA: NEGATIVE
Nitrite, UA: NEGATIVE
POC PROTEIN,UA: NEGATIVE
Spec Grav, UA: 1.025 (ref 1.010–1.025)
Urobilinogen, UA: 0.2 U/dL
pH, UA: 6 (ref 5.0–8.0)

## 2024-01-31 LAB — COMPREHENSIVE METABOLIC PANEL
ALT: 30 [IU]/L (ref 0–32)
AST: 22 [IU]/L (ref 0–40)
Albumin: 4.2 g/dL (ref 3.9–4.9)
Alkaline Phosphatase: 69 [IU]/L (ref 44–121)
BUN/Creatinine Ratio: 24 — ABNORMAL HIGH (ref 9–23)
BUN: 16 mg/dL (ref 6–20)
Bilirubin Total: 0.3 mg/dL (ref 0.0–1.2)
CO2: 21 mmol/L (ref 20–29)
Calcium: 9.9 mg/dL (ref 8.7–10.2)
Chloride: 102 mmol/L (ref 96–106)
Creatinine, Ser: 0.67 mg/dL (ref 0.57–1.00)
Globulin, Total: 3.4 g/dL (ref 1.5–4.5)
Glucose: 82 mg/dL (ref 70–99)
Potassium: 5.1 mmol/L (ref 3.5–5.2)
Sodium: 139 mmol/L (ref 134–144)
Total Protein: 7.6 g/dL (ref 6.0–8.5)
eGFR: 118 mL/min/{1.73_m2} (ref >59)

## 2024-01-31 LAB — CBC WITH DIFFERENTIAL/PLATELET
Basophils Absolute: 0.1 10*3/uL (ref 0.0–0.2)
Basos: 1 %
EOS (ABSOLUTE): 0.2 10*3/uL (ref 0.0–0.4)
Eos: 2 %
Hematocrit: 44.7 % (ref 34.0–46.6)
Hemoglobin: 14.6 g/dL (ref 11.1–15.9)
Immature Grans (Abs): 0 10*3/uL (ref 0.0–0.1)
Immature Granulocytes: 0 %
Lymphocytes Absolute: 3.7 10*3/uL — ABNORMAL HIGH (ref 0.7–3.1)
Lymphs: 25 %
MCH: 29.3 pg (ref 26.6–33.0)
MCHC: 32.7 g/dL (ref 31.5–35.7)
MCV: 90 fL (ref 79–97)
Monocytes Absolute: 1.1 10*3/uL — ABNORMAL HIGH (ref 0.1–0.9)
Monocytes: 8 %
Neutrophils Absolute: 9.8 10*3/uL — ABNORMAL HIGH (ref 1.4–7.0)
Neutrophils: 64 %
Platelets: 403 10*3/uL (ref 150–450)
RBC: 4.98 x10E6/uL (ref 3.77–5.28)
RDW: 13.1 % (ref 11.7–15.4)
WBC: 15 10*3/uL — ABNORMAL HIGH (ref 3.4–10.8)

## 2024-01-31 NOTE — Progress Notes (Signed)
Acute Office Visit  Subjective:    Patient ID: Kellie Foster, female    DOB: 01/19/1991, 33 y.o.   MRN: [id number]  Chief Complaint  Patient presents with   Back Pain    Discussed the use of AI scribe software for clinical note transcription with the patient, who gave verbal consent to proceed.   HPI: Kellie Foster is a 33 year old female who presents with low back pain. Patient states it hurts on the sides on the spine, stared over the last weekend. Pain level 5/10.   She experiences low back pain localized to the mid-lumbar region without radiation to the legs. There is no history of recent strenuous activity that could have contributed to the pain. No stomach pain, fever, nausea, vomiting, rashes, increased urination, high blood pressure, drowsiness, confusion, or swelling. She has been taking phentermine once daily and is concerned it may be causing her symptoms. No other common side effects of phentermine such as dry mouth, headache, constipation, unpleasant taste, insomnia, dizziness, restlessness, elevated blood pressure, tachycardia, or palpitations.  Recent lab work showed a slightly elevated blood urea nitrogen to creatinine ratio and an elevated white blood cell count, initially thought to be related to a sinus infection. No symptoms suggestive of a urinary tract infection, such as cloudiness in the urine.  She has a history of a kidney stone in her early twenties but has not experienced similar symptoms recently. No pain during urination, dark urine, or reduced urine output.  Past Medical History:  Diagnosis Date   Hypertension    Obesity    Recurrent UTI (urinary tract infection)     History reviewed. No pertinent surgical history.  Family History  Problem Relation Age of Onset   Hypertension Mother    Diabetes Mother    Kidney cancer Mother    Hypertension Father     Social History   Socioeconomic History   Marital status: Single    Spouse name: Not on file    Number of children: Not on file   Years of education: Not on file   Highest education level: Some college, no degree  Occupational History   Not on file  Tobacco Use   Smoking status: Never   Smokeless tobacco: Never   Tobacco comments:    2nd hand smoking exposure  Substance and Sexual Activity   Alcohol use: No   Drug use: No   Sexual activity: Not on file  Other Topics Concern   Not on file  Social History Narrative   Not on file   Social Drivers of Health   Financial Resource Strain: Low Risk  (11/23/2023)   Overall Financial Resource Strain (CARDIA)    Difficulty of Paying Living Expenses: Not hard at all  Food Insecurity: No Food Insecurity (11/23/2023)   Hunger Vital Sign    Worried About Running Out of Food in the Last Year: Never true    Ran Out of Food in the Last Year: Never true  Transportation Needs: No Transportation Needs (11/23/2023)   PRAPARE - Administrator, Civil Service (Medical): No    Lack of Transportation (Non-Medical): No  Physical Activity: Insufficiently Active (11/23/2023)   Exercise Vital Sign    Days of Exercise per Week: 5 days    Minutes of Exercise per Session: 20 min  Stress: No Stress Concern Present (11/23/2023)   [employer] of Occupational Health - Occupational Stress Questionnaire    Feeling of Stress : Not at all  Social Connections: Socially Isolated (11/23/2023)   Social Connection and Isolation Panel [NHANES]    Frequency of Communication with Friends and Family: Once a week    Frequency of Social Gatherings with Friends and Family: Once a week    Attends Religious Services: Never    Database administrator or Organizations: No    Attends Banker Meetings: Never    Marital Status: Living with partner  Intimate Partner Violence: Not At Risk (05/31/2023)   Humiliation, Afraid, Rape, and Kick questionnaire    Fear of Current or Ex-Partner: No    Emotionally Abused: No    Physically Abused: No     Sexually Abused: No    Outpatient Medications Prior to Visit  Medication Sig Dispense Refill   cetirizine (ZYRTEC ALLERGY) 10 MG tablet Take 1 tablet (10 mg total) by mouth daily. 30 tablet 3   fluticasone (FLONASE) 50 MCG/ACT nasal spray Place 2 sprays into both nostrils daily. 15.8 mL 3   meloxicam (MOBIC) 15 MG tablet Take 1 tablet (15 mg total) by mouth daily. 30 tablet 0   metFORMIN (GLUCOPHAGE) 500 MG tablet Take 1 tablet by mouth once daily with breakfast 30 tablet 0   metoprolol tartrate (LOPRESSOR) 50 MG tablet Take 1 tablet (50 mg total) by mouth 2 (two) times daily. 180 tablet 0   montelukast (SINGULAIR) 10 MG tablet Take 1 tablet (10 mg total) by mouth at bedtime. 30 tablet 3   Norgestimate-Ethinyl Estradiol Triphasic (TRI-SPRINTEC) 0.18/0.215/0.25 MG-35 MCG tablet Take 1 tablet by mouth daily. 28 tablet 5   Phentermine-Topiramate (QSYMIA) 11.25-69 MG CP24 Take 11.25 mg by mouth daily. 30 capsule 0   spironolactone (ALDACTONE) 25 MG tablet Take 1 tablet (25 mg total) by mouth daily. 90 tablet 3   No facility-administered medications prior to visit.    Allergies  Allergen Reactions   Ciprofloxacin Hcl Hives   Penicillins Hives    Has patient had a PCN reaction causing immediate rash, facial/tongue/throat swelling, SOB or lightheadedness with hypotension:YES  Has patient had a PCN reaction causing severe rash involving mucus membranes or skin necrosis:No  Has patient had a PCN reaction that required hospitalization NO  Has patient had a PCN reaction occurring within the last 10 years: NO  If all of the above answers are "NO", then may proceed with Cephalosporin use.   Sulfa Antibiotics Rash    Review of Systems  Constitutional:  Negative for chills, fatigue and fever.  HENT:  Negative for congestion, ear pain and sinus pain.   Respiratory:  Negative for shortness of breath.   Cardiovascular:  Negative for chest pain.  Gastrointestinal:  Negative for abdominal pain,  constipation, diarrhea, nausea and vomiting.  Genitourinary:  Positive for flank pain. Negative for dysuria, hematuria and urgency.  Musculoskeletal:  Positive for back pain. Negative for myalgias.  Neurological:  Negative for headaches.       Objective:        01/31/2024   10:28 AM 01/24/2024    8:48 AM 11/23/2023    9:22 AM  Vitals with BMI  Height 5\' 4"  5\' 4"    Weight 333 lbs 6 oz 333 lbs   BMI 57.2 57.13   Systolic 128 128 295  Diastolic 82 72 100  Pulse 76 72     No data found.   Physical Exam Constitutional:      General: She is not in acute distress.    Appearance: Normal appearance.  Eyes:     Conjunctiva/sclera:  Conjunctivae normal.  Neck:     Vascular: No carotid bruit.  Cardiovascular:     Rate and Rhythm: Normal rate and regular rhythm.     Heart sounds: Normal heart sounds. No murmur heard. Pulmonary:     Effort: Pulmonary effort is normal.     Breath sounds: Normal breath sounds. No wheezing.  Abdominal:     General: Bowel sounds are normal.     Palpations: Abdomen is soft.     Tenderness: There is no abdominal tenderness.  Musculoskeletal:        General: Normal range of motion.     Lumbar back: Tenderness present.  Skin:    General: Skin is warm.  Neurological:     Mental Status: She is alert. Mental status is at baseline.  Psychiatric:        Mood and Affect: Mood normal.        Behavior: Behavior normal.     Health Maintenance Due  Topic Date Due   HIV Screening  Never done    There are no preventive care reminders to display for this patient.   Lab Results  Component Value Date   TSH 4.110 10/24/2023   Lab Results  Component Value Date   WBC 15.1 (H) 01/24/2024   HGB 14.9 01/24/2024   HCT 45.4 01/24/2024   MCV 89 01/24/2024   PLT 396 01/24/2024   Lab Results  Component Value Date   NA 140 01/24/2024   K 4.6 01/24/2024   CO2 21 01/24/2024   GLUCOSE 91 01/24/2024   BUN 17 01/24/2024   CREATININE 0.71 01/24/2024    BILITOT 0.4 01/24/2024   ALKPHOS 72 01/24/2024   AST 20 01/24/2024   ALT 26 01/24/2024   PROT 7.5 01/24/2024   ALBUMIN 4.4 01/24/2024   CALCIUM 9.7 01/24/2024   ANIONGAP 13 01/06/2016   EGFR 115 01/24/2024   Lab Results  Component Value Date   CHOL 145 01/24/2024   Lab Results  Component Value Date   HDL 46 01/24/2024   Lab Results  Component Value Date   LDLCALC 84 01/24/2024   Lab Results  Component Value Date   TRIG 78 01/24/2024   Lab Results  Component Value Date   CHOLHDL 3.2 01/24/2024   Lab Results  Component Value Date   HGBA1C 5.6 10/18/2023       Assessment & Plan:  Acute bilateral low back pain without sciatica Assessment & Plan: Patient reports bilateral lower back pain, no radiation down the legs. No other associated symptoms. Possible link to Phentermine use. No signs of dehydration. Slight elevation in BUN creatinine ratio. -Discontinue Phentermine. -Start anti-inflammatory medication (Ibuprofen). -Order labs to check kidney function. -Order imaging for back pain if symptoms persist.  Orders: -     POCT URINALYSIS DIP (CLINITEK) -     Comprehensive metabolic panel -     CBC with Differential/Platelet -     CT ABDOMEN PELVIS WO CONTRAST; Future  Bilateral flank pain Assessment & Plan: UA- negative Patient denies any urinary symptoms such as pain, frequency, or changes in urine color. -No action needed at this time. -Continue to drink plenty of fluids  Orders: -     POCT URINALYSIS DIP (CLINITEK) -     Comprehensive metabolic panel -     CBC with Differential/Platelet -     CT ABDOMEN PELVIS WO CONTRAST; Future  Generalized abdominal pain -     CT ABDOMEN PELVIS WO CONTRAST; Future  Class 3 drug-induced  obesity with serious comorbidity and body mass index (BMI) of 50.0 to 59.9 in adult [facility]) Assessment & Plan: Patient on Phentermine for weight loss, now discontinued due to possible link to back pain. Patient has considered surgical  options as a last resort. -Discuss both surgical and non-surgical weight loss options at next visit.      No orders of the defined types were placed in this encounter.   Orders Placed This Encounter  Procedures   CT ABDOMEN PELVIS WO CONTRAST   Comprehensive metabolic panel   CBC with Differential   POCT URINALYSIS DIP (CLINITEK)     Follow-up: Return if symptoms worsen or fail to improve.  An After Visit Summary was printed and given to the patient.  Total time spent on today's visit was 33 minutes, including both face-to-face time and nonface-to-face time personally spent on review of chart (labs and imaging), discussing labs and goals, discussing further work-up, treatment options, referrals to specialist if needed, reviewing outside records if pertinent, answering patient's questions, and coordinating care.   Lajuana Matte, FNP [facility] [phone]

## 2024-01-31 NOTE — Assessment & Plan Note (Signed)
Patient on Phentermine for weight loss, now discontinued due to possible link to back pain. Patient has considered surgical options as a last resort. -Discuss both surgical and non-surgical weight loss options at next visit.

## 2024-01-31 NOTE — Assessment & Plan Note (Signed)
UA- negative Patient denies any urinary symptoms such as pain, frequency, or changes in urine color. -No action needed at this time. -Continue to drink plenty of fluids

## 2024-01-31 NOTE — Assessment & Plan Note (Signed)
Patient reports bilateral lower back pain, no radiation down the legs. No other associated symptoms. Possible link to Phentermine use. No signs of dehydration. Slight elevation in BUN creatinine ratio. -Discontinue Phentermine. -Start anti-inflammatory medication (Ibuprofen). -Order labs to check kidney function. -Order imaging for back pain if symptoms persist.

## 2024-02-02 ENCOUNTER — Ambulatory Visit (HOSPITAL_BASED_OUTPATIENT_CLINIC_OR_DEPARTMENT_OTHER)
Admission: RE | Admit: 2024-02-02 | Discharge: 2024-02-02 | Disposition: A | Discharge status: Home / Self Care | Payer: 59 | Source: Ambulatory Visit | Attending: Family Medicine | Admitting: Family Medicine

## 2024-02-02 DIAGNOSIS — R109 Unspecified abdominal pain: Secondary | ICD-10-CM | POA: Diagnosis not present

## 2024-02-02 DIAGNOSIS — R1084 Generalized abdominal pain: Secondary | ICD-10-CM | POA: Diagnosis not present

## 2024-02-02 DIAGNOSIS — M545 Low back pain, unspecified: Secondary | ICD-10-CM | POA: Diagnosis not present

## 2024-02-05 ENCOUNTER — Other Ambulatory Visit: Payer: Self-pay | Admitting: Family Medicine

## 2024-02-05 DIAGNOSIS — M545 Low back pain, unspecified: Secondary | ICD-10-CM

## 2024-02-06 ENCOUNTER — Other Ambulatory Visit: Payer: Self-pay | Admitting: Family Medicine

## 2024-02-06 DIAGNOSIS — E282 Polycystic ovarian syndrome: Secondary | ICD-10-CM

## 2024-02-07 ENCOUNTER — Encounter: Payer: Self-pay | Admitting: Gastroenterology

## 2024-02-27 ENCOUNTER — Other Ambulatory Visit: Payer: Self-pay

## 2024-02-27 MED ORDER — MELOXICAM 15 MG PO TABS: 15.0000 mg | ORAL_TABLET | Freq: Every day | ORAL | 0 refills | Status: DC

## 2024-03-07 ENCOUNTER — Ambulatory Visit: Payer: Self-pay

## 2024-03-07 NOTE — Telephone Encounter (Signed)
 Chief Complaint: congestion Symptoms: sinus pain and pressure, congestion, cough, chills, sneezing Frequency: since yesterday Pertinent Negatives: Patient denies CP, SOB, N/V/D Disposition: [] ED /[] Urgent Care (no appt availability in office) / [x] Appointment(In office/virtual)/ []  [facility]/ [] Home Care/ [] Refused Recommended Disposition /[] [facility]/ []  Follow-up with PCP Additional Notes: Pt reports congestion with clear mucus, cough, sinus pain and pressure ("head is stopped up"), sneezing, and chills since yesterday. Pt states she also has seasonal allergies. States she has sinus pain "off and on all the time" and wants to make sure this is nothing serious. She states one of her nostrils is blocked but she can still breathe through her nose. Pt feels like she has a temperature but has not checked it. Denies SOB, CP, N/V/D, and abdominal pain. Per protocol RN scheduled appt for pt tomorrow at 0900. Pt agreeable to that plan. RN advised pt to call us if she gets worse before then. Pt verbalized understanding.    Reason for Disposition  Lots of coughing  Answer Assessment - Initial Assessment Questions 1. LOCATION: "Where does it hurt?"      Around her nose, head "clogged" 2. ONSET: "When did the sinus pain start?"  (e.g., hours, days)      Yesterday 3. SEVERITY: "How bad is the pain?"   (Scale 1-10; mild, moderate or severe)   - MILD (1-3): doesn't interfere with normal activities    - MODERATE (4-7): interferes with normal activities (e.g., work or school) or awakens from sleep   - SEVERE (8-10): excruciating pain and patient unable to do any normal activities        5-6/10 4. RECURRENT SYMPTOM: "Have you ever had sinus problems before?" If Yes, ask: "When was the last time?" and "What happened that time?"      "Off and on all the time" 5. NASAL CONGESTION: "Is the nose blocked?" If Yes, ask: "Can you open it or must you breathe through your mouth?"     One  nostril is blocked, blocked nostril changes", she can breathe from her nose 6. NASAL DISCHARGE: "Do you have discharge from your nose?" If so ask, "What color?"     Clear 7. FEVER: "Do you have a fever?" If Yes, ask: "What is it, how was it measured, and when did it start?"      Has not checked  8. OTHER SYMPTOMS: "Do you have any other symptoms?" (e.g., sore throat, cough, earache, difficulty breathing)     "I get hot and then break out into a sweat", chills, cough, congestion (clear), allergies, sneezing. No N/V/D or abd pain.  Protocols used: Sinus Pain or Congestion-A-AH

## 2024-03-08 ENCOUNTER — Encounter: Payer: Self-pay | Admitting: Physician Assistant

## 2024-03-08 ENCOUNTER — Ambulatory Visit: Admitting: Physician Assistant

## 2024-03-08 VITALS — BP 130/62 | HR 74 | Temp 97.6°F | Resp 16 | Ht 64.0 in | Wt 319.0 lb

## 2024-03-08 DIAGNOSIS — R6889 Other general symptoms and signs: Secondary | ICD-10-CM

## 2024-03-08 DIAGNOSIS — Z1152 Encounter for screening for COVID-19: Secondary | ICD-10-CM

## 2024-03-08 LAB — POC COVID19 BINAXNOW: SARS Coronavirus 2 Ag: POSITIVE — AB

## 2024-03-08 LAB — POCT INFLUENZA A/B
Influenza A, POC: NEGATIVE
Influenza B, POC: NEGATIVE

## 2024-03-08 MED ORDER — NIRMATRELVIR/RITONAVIR (PAXLOVID)TABLET: 3.0000 | ORAL_TABLET | Freq: Two times a day (BID) | ORAL | 0 refills | Status: AC

## 2024-03-08 NOTE — Assessment & Plan Note (Signed)
 Continue to monitor symptoms Will schedule follow up if symptoms persist

## 2024-03-08 NOTE — Progress Notes (Signed)
 Acute Office Visit  Subjective:    Patient ID: Kellie Foster, female    DOB: Mar 31, 1991, 33 y.o.   MRN: [id number]  Chief Complaint  Patient presents with   Nasal Congestion   HPI: Patient is in today for nasal congestion that started yesterday.   Discussed the use of AI scribe software for clinical note transcription with the patient, who gave verbal consent to proceed.  History of Present Illness   The patient presents with worsening sinus congestion over the past two days. She reports occasional coughing, but denies any deep or persistent cough. She denies being around anyone who has been sick recently. She has been diagnosed with COVID-19. She reports having had the flu earlier in the year. She has not received the COVID-19 vaccine this year.       Past Medical History:  Diagnosis Date   Hypertension    Obesity    Recurrent UTI (urinary tract infection)     History reviewed. No pertinent surgical history.  Family History  Problem Relation Age of Onset   Hypertension Mother    Diabetes Mother    Kidney cancer Mother    Hypertension Father     Social History   Socioeconomic History   Marital status: Single    Spouse name: Not on file   Number of children: Not on file   Years of education: Not on file   Highest education level: Some college, no degree  Occupational History   Not on file  Tobacco Use   Smoking status: Never   Smokeless tobacco: Never   Tobacco comments:    2nd hand smoking exposure  Substance and Sexual Activity   Alcohol use: No   Drug use: No   Sexual activity: Not on file  Other Topics Concern   Not on file  Social History Narrative   Not on file   Social Drivers of Health   Financial Resource Strain: Low Risk  (11/23/2023)   Overall Financial Resource Strain (CARDIA)    Difficulty of Paying Living Expenses: Not hard at all  Food Insecurity: No Food Insecurity (11/23/2023)   Hunger Vital Sign    Worried About Running Out of Food  in the Last Year: Never true    Ran Out of Food in the Last Year: Never true  Transportation Needs: No Transportation Needs (11/23/2023)   PRAPARE - Administrator, Civil Service (Medical): No    Lack of Transportation (Non-Medical): No  Physical Activity: Insufficiently Active (11/23/2023)   Exercise Vital Sign    Days of Exercise per Week: 5 days    Minutes of Exercise per Session: 20 min  Stress: No Stress Concern Present (11/23/2023)   [employer] of Occupational Health - Occupational Stress Questionnaire    Feeling of Stress : Not at all  Social Connections: Socially Isolated (11/23/2023)   Social Connection and Isolation Panel [NHANES]    Frequency of Communication with Friends and Family: Once a week    Frequency of Social Gatherings with Friends and Family: Once a week    Attends Religious Services: Never    Database administrator or Organizations: No    Attends Banker Meetings: Never    Marital Status: Living with partner  Intimate Partner Violence: Not At Risk (05/31/2023)   Humiliation, Afraid, Rape, and Kick questionnaire    Fear of Current or Ex-Partner: No    Emotionally Abused: No    Physically Abused: No    Sexually  Abused: No    Outpatient Medications Prior to Visit  Medication Sig Dispense Refill   cetirizine (ZYRTEC ALLERGY) 10 MG tablet Take 1 tablet (10 mg total) by mouth daily. 30 tablet 3   fluticasone (FLONASE) 50 MCG/ACT nasal spray Place 2 sprays into both nostrils daily. 15.8 mL 3   meloxicam (MOBIC) 15 MG tablet Take 1 tablet (15 mg total) by mouth daily. 30 tablet 0   metFORMIN (GLUCOPHAGE) 500 MG tablet Take 1 tablet by mouth once daily with breakfast 30 tablet 0   metoprolol tartrate (LOPRESSOR) 50 MG tablet Take 1 tablet (50 mg total) by mouth 2 (two) times daily. 180 tablet 0   montelukast (SINGULAIR) 10 MG tablet Take 1 tablet (10 mg total) by mouth at bedtime. 30 tablet 3   Norgestimate-Ethinyl Estradiol Triphasic  (TRI-SPRINTEC) 0.18/0.215/0.25 MG-35 MCG tablet Take 1 tablet by mouth daily. 28 tablet 5   Phentermine-Topiramate (QSYMIA) 11.25-69 MG CP24 Take 11.25 mg by mouth daily. 30 capsule 0   spironolactone (ALDACTONE) 25 MG tablet Take 1 tablet (25 mg total) by mouth daily. 90 tablet 3   No facility-administered medications prior to visit.    Allergies  Allergen Reactions   Ciprofloxacin Hcl Hives   Penicillins Hives    Has patient had a PCN reaction causing immediate rash, facial/tongue/throat swelling, SOB or lightheadedness with hypotension:YES  Has patient had a PCN reaction causing severe rash involving mucus membranes or skin necrosis:No  Has patient had a PCN reaction that required hospitalization NO  Has patient had a PCN reaction occurring within the last 10 years: NO  If all of the above answers are "NO", then may proceed with Cephalosporin use.   Sulfa Antibiotics Rash    Review of Systems  Constitutional:  Positive for chills. Negative for fatigue and fever.  HENT:  Positive for congestion, rhinorrhea, sinus pressure and sneezing.   Eyes: Negative.   Respiratory:  Positive for cough. Negative for chest tightness, shortness of breath and wheezing.   Cardiovascular:  Negative for chest pain, palpitations and leg swelling.  Gastrointestinal:  Negative for constipation, diarrhea, nausea and vomiting.  Endocrine: Negative.   Genitourinary:  Negative for dysuria, frequency, hematuria and urgency.  Musculoskeletal: Negative.   Neurological:  Positive for dizziness. Negative for light-headedness.  Hematological: Negative.   Psychiatric/Behavioral:  Negative for agitation. The patient is not nervous/anxious.        Objective:        03/08/2024    8:38 AM 01/31/2024   10:28 AM 01/24/2024    8:48 AM  Vitals with BMI  Height 5\' 4"  5\' 4"  5\' 4"   Weight 319 lbs 333 lbs 6 oz 333 lbs  BMI 54.73 57.2 57.13  Systolic 130 128 161  Diastolic 62 82 72  Pulse 74 76 72    No data  found.   Physical Exam Vitals reviewed.  Constitutional:      Appearance: Normal appearance.  Cardiovascular:     Rate and Rhythm: Normal rate and regular rhythm.     Heart sounds: Normal heart sounds.  Pulmonary:     Effort: Pulmonary effort is normal.     Breath sounds: Normal breath sounds.  Abdominal:     General: Bowel sounds are normal.     Palpations: Abdomen is soft.     Tenderness: There is no abdominal tenderness.  Neurological:     Mental Status: She is alert and oriented to person, place, and time.  Psychiatric:  Mood and Affect: Mood normal.        Behavior: Behavior normal.     Health Maintenance Due  Topic Date Due   HIV Screening  Never done    There are no preventive care reminders to display for this patient.   Lab Results  Component Value Date   TSH 4.110 10/24/2023   Lab Results  Component Value Date   WBC 15.0 (H) 01/31/2024   HGB 14.6 01/31/2024   HCT 44.7 01/31/2024   MCV 90 01/31/2024   PLT 403 01/31/2024   Lab Results  Component Value Date   NA 139 01/31/2024   K 5.1 01/31/2024   CO2 21 01/31/2024   GLUCOSE 82 01/31/2024   BUN 16 01/31/2024   CREATININE 0.67 01/31/2024   BILITOT 0.3 01/31/2024   ALKPHOS 69 01/31/2024   AST 22 01/31/2024   ALT 30 01/31/2024   PROT 7.6 01/31/2024   ALBUMIN 4.2 01/31/2024   CALCIUM 9.9 01/31/2024   ANIONGAP 13 01/06/2016   EGFR 118 01/31/2024   Lab Results  Component Value Date   CHOL 145 01/24/2024   Lab Results  Component Value Date   HDL 46 01/24/2024   Lab Results  Component Value Date   LDLCALC 84 01/24/2024   Lab Results  Component Value Date   TRIG 78 01/24/2024   Lab Results  Component Value Date   CHOLHDL 3.2 01/24/2024   Lab Results  Component Value Date   HGBA1C 5.6 10/18/2023       Assessment & Plan:  Flu-like symptoms Assessment & Plan: Acute COVID-19 with sinus congestion and intermittent cough. Discussed Paxlovid to reduce symptom duration and  severity. Potential secondary infection post-COVID. Paxlovid may reduce contraceptive effectiveness. She has not received the COVID vaccine this year. - Prescribe Paxlovid and send prescription to [facility]. - Advise use of nasal decongestants, antihistamines, acetaminophen, guaifenesin, and antitussives as needed. - Advise wearing a mask for at least five days if going out. - Encourage fluid intake with options like Liquid IV, Pedialyte, Gatorade, or Powerade Zero. - Monitor for secondary infection symptoms, especially after ten days. - Discuss potential reduced effectiveness of birth control while on Paxlovid.   Orders: -     POC COVID-19 BinaxNow -     POCT Influenza A/B  Encounter for screening for COVID-19 Assessment & Plan: Continue to monitor symptoms Will schedule follow up if symptoms persist  Orders: -     POC COVID-19 BinaxNow -     nirmatrelvir/ritonavir; Take 3 tablets by mouth 2 (two) times daily for 5 days. (Take nirmatrelvir 150 mg two tablets twice daily for 5 days and ritonavir 100 mg one tablet twice daily for 5 days) Patient GFR is 115  Dispense: 30 tablet; Refill: 0      Meds ordered this encounter  Medications   nirmatrelvir/ritonavir (PAXLOVID) 20 x 150 MG & 10 x 100MG  TABS    Sig: Take 3 tablets by mouth 2 (two) times daily for 5 days. (Take nirmatrelvir 150 mg two tablets twice daily for 5 days and ritonavir 100 mg one tablet twice daily for 5 days) Patient GFR is 115    Dispense:  30 tablet    Refill:  0    Orders Placed This Encounter  Procedures   POC COVID-19   POCT Influenza A/B       Follow-up: Return if symptoms worsen or fail to improve.  An After Visit Summary was printed and given to the patient.  Langley Gauss,  PA [facility] [phone]

## 2024-03-08 NOTE — Assessment & Plan Note (Signed)
 Acute COVID-19 with sinus congestion and intermittent cough. Discussed Paxlovid to reduce symptom duration and severity. Potential secondary infection post-COVID. Paxlovid may reduce contraceptive effectiveness. She has not received the COVID vaccine this year. - Prescribe Paxlovid and send prescription to [facility]. - Advise use of nasal decongestants, antihistamines, acetaminophen, guaifenesin, and antitussives as needed. - Advise wearing a mask for at least five days if going out. - Encourage fluid intake with options like Liquid IV, Pedialyte, Gatorade, or Powerade Zero. - Monitor for secondary infection symptoms, especially after ten days. - Discuss potential reduced effectiveness of birth control while on Paxlovid.

## 2024-03-08 NOTE — Patient Instructions (Signed)
 VISIT SUMMARY:  You came in today with worsening sinus congestion over the past two days and occasional coughing. You have been diagnosed with COVID-19. You mentioned that you had the flu earlier this year and have not received the COVID-19 vaccine this year.  YOUR PLAN:  -COVID-19 INFECTION: You have an acute COVID-19 infection with sinus congestion and intermittent cough. COVID-19 is a viral infection that can cause a range of symptoms. To help reduce the duration and severity of your symptoms, I have prescribed Paxlovid, which has been sent to [facility]. Please use nasal decongestants, antihistamines, acetaminophen, guaifenesin, and antitussives as needed to manage your symptoms. Remember to wear a mask for at least five days if you need to go out. Stay hydrated with fluids like Liquid IV, Pedialyte, Gatorade, or Powerade Zero. Monitor for any signs of a secondary infection, especially after ten days. Note that Paxlovid may reduce the effectiveness of your birth control, so please take additional precautions.  INSTRUCTIONS:  Please follow up if you notice any symptoms of a secondary infection, especially after ten days. Continue to monitor your symptoms and take the prescribed medications as directed.

## 2024-03-20 ENCOUNTER — Other Ambulatory Visit: Payer: Self-pay | Admitting: Family Medicine

## 2024-03-20 DIAGNOSIS — E282 Polycystic ovarian syndrome: Secondary | ICD-10-CM

## 2024-03-22 ENCOUNTER — Encounter: Payer: Self-pay | Admitting: Gastroenterology

## 2024-03-22 ENCOUNTER — Other Ambulatory Visit (INDEPENDENT_AMBULATORY_CARE_PROVIDER_SITE_OTHER)

## 2024-03-22 ENCOUNTER — Ambulatory Visit: Payer: 59 | Admitting: Gastroenterology

## 2024-03-22 VITALS — BP 130/80 | HR 78 | Ht 64.0 in | Wt 323.0 lb

## 2024-03-22 DIAGNOSIS — K5790 Diverticulosis of intestine, part unspecified, without perforation or abscess without bleeding: Secondary | ICD-10-CM | POA: Diagnosis not present

## 2024-03-22 DIAGNOSIS — K760 Fatty (change of) liver, not elsewhere classified: Secondary | ICD-10-CM

## 2024-03-22 LAB — IBC + FERRITIN
Ferritin: 67.9 ng/mL (ref 10.0–291.0)
Iron: 71 ug/dL (ref 42–145)
Saturation Ratios: 21.5 % (ref 20.0–50.0)
TIBC: 330.4 ug/dL (ref 250.0–450.0)
Transferrin: 236 mg/dL (ref 212.0–360.0)

## 2024-03-22 LAB — PROTIME-INR
INR: 1.1 ratio — ABNORMAL HIGH (ref 0.8–1.0)
Prothrombin Time: 11.4 s (ref 9.6–13.1)

## 2024-03-22 NOTE — Patient Instructions (Signed)
 You will be contacted by [facility] Scheduling in the next 2 days to arrange a Ultrasound Liver Elastography.  The number on your caller ID will be [phone], please answer when they call.  If you have not heard from them in 2 days please call [phone] to schedule.    Your provider has requested that you go to the basement level for lab work before leaving today. Press "B" on the elevator. The lab is located at the first door on the left as you exit the elevator.   High Fiber Handout given   High-Fiber Eating Plan Fiber, also called dietary fiber, is found in foods such as fruits, vegetables, whole grains, and beans. A high-fiber diet can be good for your health. Your health care provider may recommend a high-fiber diet to help: Prevent trouble pooping (constipation). Lower your cholesterol. Treat the following conditions: Hemorrhoids. This is inflammation of veins in the anus. Inflammation of specific areas of the digestive tract. Irritable bowel syndrome (IBS). This is a problem of the large intestine, also called the colon, that sometimes causes belly pain and bloating. Prevent overeating as part of a weight-loss plan. Lower the risk of heart disease, type 2 diabetes, and certain cancers. What are tips for following this plan? Reading food labels  Check the nutrition facts label on foods for the amount of dietary fiber. Choose foods that have 4 grams of fiber or more per serving. The recommended goals for how much fiber you should eat each day include: Males 42 years old or younger: 30-34 g. Males over 61 years old: 28-34 g. Females 71 years old or younger: 25-28 g. Females over 68 years old: 22-25 g. Your daily fiber goal is _____________ g. Shopping Choose whole fruits and vegetables instead of processed. For example, choose apples instead of apple juice or applesauce. Choose a variety of high-fiber foods such as avocados, lentils, oats, and pinto beans. Read the  nutrition facts label on foods. Check for foods with added fiber. These foods often have high sugar and salt (sodium) amounts per serving. Cooking Use whole-grain flour for baking and cooking. Cook with brown rice instead of white rice. Make meals that have a lot of beans and vegetables in them, such as chili or vegetable-based soups. Meal planning Start the day with a breakfast that is high in fiber, such as a cereal that has 5 g of fiber or more per serving. Eat breads and cereals that are made with whole-grain flour instead of refined flour or white flour. Eat brown rice, bulgur wheat, or millet instead of white rice. Use beans in place of meat in soups, salads, and pasta dishes. Be sure that half of the grains you eat each day are whole grains. General information You can get the recommended amount of dietary fiber by: Eating a variety of fruits, vegetables, grains, nuts, and beans. Taking a fiber supplement if you aren't able to eat enough fiber. It's better to get fiber through food than from a supplement. Slowly increase how much fiber you eat. If you increase the amount of fiber you eat too quickly, you may have bloating, cramping, or gas. Drink plenty of water to help you digest fiber. Choose high-fiber snacks, such as berries, raw vegetables, nuts, and popcorn. What foods should I eat? Fruits Berries. Pears. Apples. Oranges. Avocado. Prunes and raisins. Dried figs. Vegetables Sweet potatoes. Spinach. Kale. Artichokes. Cabbage. Broccoli. Cauliflower. Green peas. Carrots. Squash. Grains Whole-grain breads. Multigrain cereal. Oats and oatmeal. Brown rice.  Barley. Bulgur wheat. Millet. Quinoa. Bran muffins. Popcorn. Rye wafer crackers. Meats and other proteins Navy beans, kidney beans, and pinto beans. Soybeans. Split peas. Lentils. Nuts and seeds. Dairy Fiber-fortified yogurt. Fortified means that fiber has been added to the product. Beverages Fiber-fortified soy milk.  Fiber-fortified orange juice. Other foods Fiber bars. The items listed above may not be all the foods and drinks you can have. Talk to a dietitian to learn more. What foods should I avoid? Fruits Fruit juice. Cooked, strained fruit. Vegetables Fried potatoes. Canned vegetables. Well-cooked vegetables. Grains White bread. Pasta made with refined flour. White rice. Meats and other proteins Fatty meat. Fried chicken or fried fish. Dairy Milk. Cream cheese. Sour cream. Fats and oils Butters. Beverages Soft drinks. Other foods Cakes and pastries. The items listed above may not be all the foods and drinks you should avoid. Talk to a dietitian to learn more. This information is not intended to replace advice given to you by your health care provider. Make sure you discuss any questions you have with your health care provider. Document Revised: 02/20/2023 Document Reviewed: 02/20/2023 Elsevier Patient Education  2024 [employer].   Due to recent changes in healthcare laws, you may see the results of your imaging and laboratory studies on MyChart before your provider has had a chance to review them.  We understand that in some cases there may be results that are confusing or concerning to you. Not all laboratory results come back in the same time frame and the provider may be waiting for multiple results in order to interpret others.  Please give [place] 48 hours in order for your provider to thoroughly review all the results before contacting the office for clarification of your results.    I appreciate the  opportunity to care for you  Thank You   Deanna May,PA-C

## 2024-03-22 NOTE — Progress Notes (Addendum)
 Chief Complaint:CT showed diverticulosis and fatty liver  Primary GI Doctor:Dr. Venice Gillis  HPI:  Patient is a 33 year old female patient with past medical history of hypertension, PCOS, obesity, who was referred to me by Janece Means, FNP on 02/05/24 for a complaint of CT showed diverticulosis and fatty liver .    Interval History    Patient states she started experiencing lower back pain back in January and they had concerns for kidney stones so they ordered imaging which showed incidental findings of fatty liver and diverticulosis. There were no evidence of kidney stones. They discontinued her phentermine  to see if it was a possible side effect and she states her symptoms resolved after stopping medication.      She denies any gastrointestinal issues today. Patient denies nausea, vomiting, or weight loss. Patient denies altered bowel habits, abdominal pain, or rectal bleeding.  Patient recently started Metformin  back in December , tolerated well. She has regular bowel movement daily.Patient tells me she recently started making dietary changes and following Mediterranean diet.  Nonsmoker. No alcohol use. Patient denies IV drug use. No history of hepatitis.  Patient takes Meloxicam  prn, very seldom.   Never seen by GI.  Patient's family history includes mother with kidney CA. Father with diverticular disease.  Patients fathers mothers Aunt had cirrhosis.   Wt Readings from Last 3 Encounters:  03/22/24 (!) 323 lb (146.5 kg)  03/08/24 (!) 319 lb (144.7 kg)  01/31/24 (!) 333 lb 6.4 oz (151.2 kg)    Past Medical History:  Diagnosis Date   Hypertension    Obesity    Recurrent UTI (urinary tract infection)    History reviewed. No pertinent surgical history.  Current Outpatient Medications  Medication Sig Dispense Refill   cetirizine  (ZYRTEC  ALLERGY) 10 MG tablet Take 1 tablet (10 mg total) by mouth daily. 30 tablet 3   fluticasone  (FLONASE ) 50 MCG/ACT nasal spray Place 2 sprays into  both nostrils daily. 15.8 mL 3   meloxicam  (MOBIC ) 15 MG tablet Take 1 tablet (15 mg total) by mouth daily. 30 tablet 0   metFORMIN  (GLUCOPHAGE ) 500 MG tablet Take 1 tablet by mouth once daily with breakfast 30 tablet 0   metoprolol  tartrate (LOPRESSOR ) 50 MG tablet Take 1 tablet (50 mg total) by mouth 2 (two) times daily. 180 tablet 0   montelukast  (SINGULAIR ) 10 MG tablet Take 1 tablet (10 mg total) by mouth at bedtime. 30 tablet 3   Norgestimate-Ethinyl Estradiol Triphasic (TRI-SPRINTEC) 0.18/0.215/0.25 MG-35 MCG tablet Take 1 tablet by mouth daily. 28 tablet 5   spironolactone  (ALDACTONE ) 25 MG tablet Take 1 tablet (25 mg total) by mouth daily. 90 tablet 3   No current facility-administered medications for this visit.    Allergies as of 03/22/2024 - Review Complete 03/22/2024  Allergen Reaction Noted   Ciprofloxacin  hcl Hives 09/12/2016   Penicillins Hives 01/29/2015   Sulfa antibiotics Rash 01/06/2016    Family History  Problem Relation Age of Onset   Hypertension Mother    Diabetes Mother    Kidney cancer Mother    Hypertension Father     Review of Systems:    Constitutional: No weight loss, fever, chills, weakness or fatigue HEENT: Eyes: No change in vision               Ears, Nose, Throat:  No change in hearing or congestion Skin: No rash or itching Cardiovascular: No chest pain, chest pressure or palpitations   Respiratory: No SOB or cough Gastrointestinal:  See HPI and otherwise negative Genitourinary: No dysuria or change in urinary frequency Neurological: No headache, dizziness or syncope Musculoskeletal: No new muscle or joint pain Hematologic: No bleeding or bruising Psychiatric: No history of depression or anxiety    Physical Exam:  Vital signs: BP 130/80   Pulse 78   Ht 5\' 4"  (1.626 m)   Wt (!) 323 lb (146.5 kg)   LMP 02/11/2024   BMI 55.44 kg/m   Constitutional:  Pleasant  female appears to be in NAD, Well developed, Well nourished, alert and  cooperative Throat: Oral cavity and pharynx without inflammation, swelling or lesion.  Respiratory: Respirations even and unlabored. Lungs clear to auscultation bilaterally.   No wheezes, crackles, or rhonchi.  Cardiovascular: Normal S1, S2. Regular rate and rhythm. No peripheral edema, cyanosis or pallor.  Gastrointestinal:  Soft, nondistended, nontender. Obese. No rebound or guarding. Normal bowel sounds. No appreciable masses or hepatomegaly. Rectal:  Not performed.  Msk:  Symmetrical without gross deformities. Without edema, no deformity or joint abnormality.  Neurologic:  Alert and  oriented x4;  grossly normal neurologically.  Skin:   Dry and intact without significant lesions or rashes. Psychiatric: Oriented to person, place and time. Demonstrates good judgement and reason without abnormal affect or behaviors.  RELEVANT LABS AND IMAGING: CBC    Latest Ref Rng & Units 01/31/2024   11:02 AM 01/24/2024    9:25 AM 10/24/2023    3:53 PM  CBC  WBC 3.4 - 10.8 x10E3/uL 15.0  15.1  14.0   Hemoglobin 11.1 - 15.9 g/dL 16.1  09.6  04.5   Hematocrit 34.0 - 46.6 % 44.7  45.4  47.5   Platelets 150 - 450 x10E3/uL 403  396  368      CMP     Latest Ref Rng & Units 01/31/2024   11:02 AM 01/24/2024    9:25 AM 10/24/2023    3:53 PM  CMP  Glucose 70 - 99 mg/dL 82  91  89   BUN 6 - 20 mg/dL 16  17  19    Creatinine 0.57 - 1.00 mg/dL 4.09  8.11  9.14   Sodium 134 - 144 mmol/L 139  140  142   Potassium 3.5 - 5.2 mmol/L 5.1  4.6  4.6   Chloride 96 - 106 mmol/L 102  102  101   CO2 20 - 29 mmol/L 21  21  19    Calcium 8.7 - 10.2 mg/dL 9.9  9.7  78.2   Total Protein 6.0 - 8.5 g/dL 7.6  7.5  7.6   Total Bilirubin 0.0 - 1.2 mg/dL 0.3  0.4  0.2   Alkaline Phos 44 - 121 IU/L 69  72  70   AST 0 - 40 IU/L 22  20  25    ALT 0 - 32 IU/L 30  26  27       Lab Results  Component Value Date   TSH 4.110 10/24/2023   02/02/24 CT ABD/pelvis IMPRESSION: 1. No acute abnormality in the abdomen or pelvis.  Specifically, no evidence of obstructive uropathy. 2. Diffuse hepatic steatosis. 3. Left-sided colonic diverticulosis without evidence of acute diverticulitis. 4. 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 01/29/15 CT Renal stone IMPRESSION: Mild to moderate right hydronephrosis due to a 1-2 mm proximal right ureteral stone. No other urinary tract stones are identified. Fatty infiltration of the liver. Mild diverticulosis without diverticulitis. Bilateral L5 pars interarticularis defects without anterolisthesis L5 on S1.  Assessment: Encounter Diagnoses  Name Primary?  Fatty liver Yes   Diverticulosis         33 year old female patient who presents to discuss incidental findings on CT scan of fatty liver disease and diverticulosis.  At the time patient had only presented with lower back pain and kidney stones was ruled out.  They discontinued her phentermine  which resolved to the lower back pain.      We spent several minutes discussing diverticular disease and following high fiber diet. ER precautions given. We also discussed fatty liver disease and will order full lab workup to r/o autoimmune, genetic, or viral causes. Will order liver elastography to stage inflammation and potentially consider alternative therapies. Recommended weight loss and dietary changes. She does not consume alcohol. Normal LFT's, PLT 396.  Plan: -Order ANA, AMA, Anti-smooth muscle antibody, Hepatitis A IgG,Hepatitis B surface antigen, Hepatitis B surface antibody, HCV antibody, ferritin, TIBC,  Alpha 1 antitrypsin, ceruloplasmin, tTG, total IgA, PT/INR, IgG - Order Abd U/S elastography - Educated on dietary changes, weight loss, and physical exercise as tolerated  Thank you for the courtesy of this consult. Please call me with any questions or concerns.   Caya Soberanis, FNP-C [facility] 03/22/2024, 9:03 AM  Cc: Janece Means, FNP

## 2024-03-25 ENCOUNTER — Other Ambulatory Visit (HOSPITAL_COMMUNITY)

## 2024-03-27 ENCOUNTER — Ambulatory Visit (HOSPITAL_BASED_OUTPATIENT_CLINIC_OR_DEPARTMENT_OTHER)
Admission: RE | Admit: 2024-03-27 | Discharge: 2024-03-27 | Disposition: A | Discharge status: Home / Self Care | Source: Ambulatory Visit | Attending: Gastroenterology | Admitting: Gastroenterology

## 2024-03-27 DIAGNOSIS — K760 Fatty (change of) liver, not elsewhere classified: Secondary | ICD-10-CM | POA: Diagnosis not present

## 2024-03-27 DIAGNOSIS — K5790 Diverticulosis of intestine, part unspecified, without perforation or abscess without bleeding: Secondary | ICD-10-CM

## 2024-03-27 LAB — ANTI-NUCLEAR AB-TITER (ANA TITER)
ANA TITER: 1:40 {titer} — ABNORMAL HIGH
ANA Titer 1: 1:40 {titer} — ABNORMAL HIGH

## 2024-03-27 LAB — ANTI-SMOOTH MUSCLE ANTIBODY, IGG: Actin (Smooth Muscle) Antibody (IGG): <20 U (ref <20)

## 2024-03-27 LAB — IGA: Immunoglobulin A: 470 mg/dL — ABNORMAL HIGH (ref 47–310)

## 2024-03-27 LAB — TISSUE TRANSGLUTAMINASE, IGA: (tTG) Ab, IgA: <1 U/mL

## 2024-03-27 LAB — CERULOPLASMIN: Ceruloplasmin: 29 mg/dL (ref 14–48)

## 2024-03-27 LAB — HEPATITIS C ANTIBODY: Hepatitis C Ab: NONREACTIVE

## 2024-03-27 LAB — ANA: Anti Nuclear Antibody (ANA): POSITIVE — AB

## 2024-03-27 LAB — IGG: IgG (Immunoglobin G), Serum: 1341 mg/dL (ref 600–1640)

## 2024-03-27 LAB — HEPATITIS B SURFACE ANTIBODY,QUALITATIVE: Hep B S Ab: NONREACTIVE

## 2024-03-27 LAB — ALPHA-1-ANTITRYPSIN: A-1 Antitrypsin, Ser: 156 mg/dL (ref 83–199)

## 2024-03-27 LAB — HEPATITIS A ANTIBODY, TOTAL: Hepatitis A AB,Total: NONREACTIVE

## 2024-03-27 LAB — MITOCHONDRIAL ANTIBODIES: Mitochondrial M2 Ab, IgG: <=20 U (ref <=20.0)

## 2024-03-27 LAB — HEPATITIS B SURFACE ANTIGEN: Hepatitis B Surface Ag: NONREACTIVE

## 2024-04-14 NOTE — Progress Notes (Signed)
 No need yet, kPa 7.1 (Rezdiffra usually above 9) Get Fib 4 index at next visit in 12 weeks including LFTs Please send her back to [facility] (PCP)-if she qualifies for GLP-1 Send report to family physician

## 2024-04-15 ENCOUNTER — Other Ambulatory Visit: Payer: Self-pay | Admitting: Family Medicine

## 2024-04-15 DIAGNOSIS — E282 Polycystic ovarian syndrome: Secondary | ICD-10-CM

## 2024-04-19 ENCOUNTER — Other Ambulatory Visit: Payer: Self-pay | Admitting: Family Medicine

## 2024-04-19 DIAGNOSIS — I10 Essential (primary) hypertension: Secondary | ICD-10-CM

## 2024-04-25 ENCOUNTER — Encounter: Payer: Self-pay | Admitting: Family Medicine

## 2024-04-25 ENCOUNTER — Ambulatory Visit: Admitting: Family Medicine

## 2024-04-25 ENCOUNTER — Ambulatory Visit: Payer: Self-pay

## 2024-04-25 VITALS — BP 130/78 | HR 70 | Temp 97.8°F | Resp 16 | Ht 64.0 in | Wt 311.4 lb

## 2024-04-25 DIAGNOSIS — K7581 Nonalcoholic steatohepatitis (NASH): Secondary | ICD-10-CM | POA: Diagnosis not present

## 2024-04-25 DIAGNOSIS — L03012 Cellulitis of left finger: Secondary | ICD-10-CM | POA: Diagnosis not present

## 2024-04-25 MED ORDER — CEPHALEXIN 500 MG PO CAPS: 500.0000 mg | ORAL_CAPSULE | Freq: Two times a day (BID) | ORAL | 0 refills | Status: AC

## 2024-04-25 MED ORDER — OZEMPIC (0.25 OR 0.5 MG/DOSE) 2 MG/3ML ~~LOC~~ SOPN: 0.2500 mg | PEN_INJECTOR | SUBCUTANEOUS | 0 refills | Status: DC

## 2024-04-25 NOTE — Telephone Encounter (Signed)
  Chief Complaint: finger swollen  Symptoms: swollen and red Frequency: x 1 days Pertinent Negatives: Patient denies fever Disposition: [] ED /[] Urgent Care (no appt availability in office) / [x] Appointment(In office/virtual)/ []  [facility]/ [] Home Care/ [] Refused Recommended Disposition /[] Start Mobile Bus/ []  Follow-up with PCP Additional Notes: pt states that she had a hang nail on Wednesday and she picked it and now believes fingernail is infected. States swelling and redness. States pain 7/10. States it is the left index finger  Copied from CRM [id number]. Topic: Clinical - Red Word Triage >> Apr 25, 2024  9:25 AM Chrystal Crape R wrote: Finger red and swollen after picking hang nail. Has gotten worst over night. Reason for Disposition  [1] MODERATE-SEVERE pain AND [2] blood present under a nail  Answer Assessment - Initial Assessment Questions 1. MECHANISM: "How did the injury happen?"      Picked a hang nail 2. ONSET: "When did the injury happen?" (Minutes or hours ago)      wednesday 3. LOCATION: "What part of the finger is injured?" "Is the nail damaged?"      Left index  4. APPEARANCE of the INJURY: "What does the injury look like?"      red 5. SEVERITY: "Can you use the hand normally?"  "Can you bend your fingers into a ball and then fully open them?"     yes  7. PAIN: "Is there pain?" If Yes, ask: "How bad is the pain?"    (e.g., Scale 1-10; or mild, moderate, severe)  - NONE (0): no pain.  - MILD (1-3): doesn't interfere with normal activities.   - MODERATE (4-7): interferes with normal activities or awakens from sleep.  - SEVERE (8-10): excruciating pain, unable to hold a glass of water or bend finger even a little.     mod 8. TETANUS: For any breaks in the skin, ask: "When was the last tetanus booster?"      9. OTHER SYMPTOMS: "Do you have any other symptoms?"     swelling  Protocols used: Finger Injury-A-AH

## 2024-04-25 NOTE — Assessment & Plan Note (Signed)
 Fatty liver with family history of cirrhosis. Following Mediterranean diet with significant weight loss. Discussed potential use of Ozempic  pending insurance approval. - Submit request for Ozempic  approval for fatty liver treatment. - Continue Mediterranean diet and weight loss efforts.

## 2024-04-25 NOTE — Progress Notes (Signed)
 Acute Office Visit  Subjective:    Patient ID: Kellie Foster, female    DOB: [birth year], 33 y.o.   MRN: [id number]  Chief Complaint  Patient presents with   Hand Pain    Swelling and pain in left index finger   Discussed the use of AI scribe software for clinical note transcription with the patient, who gave verbal consent to proceed.  History of Present Illness   Kellie Foster is a 33 year old female who presents with a swollen finger, suspected to be a hangnail infection.  She has a swollen finger, with swelling beginning less than six weeks ago and pain starting yesterday. The pain is localized to the finger and worsens with contact. 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 No nail biting, but she admits to cutting nails very short.  She has a history of fatty liver. An aunt on her father's side had fatty liver and died from cirrhosis. She has been following a Mediterranean diet and has lost over ten pounds, reducing her weight from 323 to 311 pounds. She previously took phentermine  but discontinued it due to issues. She walks three times a week, primarily on weekends, as her schedule allows.  She is allergic to penicillin and Cipro , which cause rash and hives, but she does not experience breathing difficulties. She has antihistamines like Benadryl at home for allergic reactions.       Past Medical History:  Diagnosis Date   Hypertension    Obesity    Recurrent UTI (urinary tract infection)     History reviewed. No pertinent surgical history.  Family History  Problem Relation Age of Onset   Hypertension Mother    Diabetes Mother    Kidney cancer Mother    Hypertension Father     Social History   Socioeconomic History   Marital status: Single    Spouse name: Not on file   Number of children: Not on file   Years of education: Not on file   Highest education level: Some college, no degree   Occupational History   Not on file  Tobacco Use   Smoking status: Never   Smokeless tobacco: Never   Tobacco comments:    2nd hand smoking exposure  Substance and Sexual Activity   Alcohol use: No   Drug use: No   Sexual activity: Not on file  Other Topics Concern   Not on file  Social History Narrative   Not on file   Social Drivers of Health   Financial Resource Strain: Low Risk  (11/23/2023)   Overall Financial Resource Strain (CARDIA)    Difficulty of Paying Living Expenses: Not hard at all  Food Insecurity: No Food Insecurity (11/23/2023)   Hunger Vital Sign    Worried About Running Out of Food in the Last Year: Never true    Ran Out of Food in the Last Year: Never true  Transportation Needs: No Transportation Needs (11/23/2023)   PRAPARE - Administrator, Civil Service (Medical): No    Lack of Transportation (Non-Medical): No  Physical Activity: Insufficiently Active (11/23/2023)   Exercise Vital Sign    Days of Exercise per Week: 5 days    Minutes of Exercise per Session: 20 min  Stress: No Stress Concern Present (11/23/2023)   [employer] of Occupational Health - Occupational Stress Questionnaire    Feeling of Stress : Not at all  Social Connections: Socially Isolated (11/23/2023)   Social Connection and Isolation Panel [NHANES]    Frequency of Communication with Friends and Family: Once a week    Frequency of Social Gatherings with Friends and Family: Once a week    Attends Religious Services: Never    Database administrator or Organizations: No    Attends Banker Meetings: Never    Marital Status: Living with partner  Intimate Partner Violence: Not At Risk (05/31/2023)   Humiliation, Afraid, Rape, and Kick questionnaire    Fear of Current or Ex-Partner: No    Emotionally Abused: No    Physically Abused: No    Sexually Abused: No    Outpatient Medications Prior to Visit  Medication Sig Dispense Refill   cetirizine  (ZYRTEC   ALLERGY) 10 MG tablet Take 1 tablet (10 mg total) by mouth daily. 30 tablet 3   fluticasone  (FLONASE ) 50 MCG/ACT nasal spray Place 2 sprays into both nostrils daily. 15.8 mL 3   meloxicam  (MOBIC ) 15 MG tablet Take 1 tablet (15 mg total) by mouth daily. 30 tablet 0   metFORMIN  (GLUCOPHAGE ) 500 MG tablet Take 1 tablet by mouth once daily with breakfast 30 tablet 0   metoprolol  tartrate (LOPRESSOR ) 50 MG tablet Take 1 tablet by mouth twice daily 180 tablet 0   montelukast  (SINGULAIR ) 10 MG tablet Take 1 tablet (10 mg total) by mouth at bedtime. 30 tablet 3   Norgestimate-Ethinyl Estradiol Triphasic (TRI-SPRINTEC) 0.18/0.215/0.25 MG-35 MCG tablet Take 1 tablet by mouth daily. 28 tablet 5   spironolactone  (ALDACTONE ) 25 MG tablet Take 1 tablet (25 mg total) by mouth daily. 90 tablet 3   No facility-administered medications prior to visit.    Allergies  Allergen Reactions   Ciprofloxacin  Hcl Hives   Penicillins Hives    Has patient had a PCN reaction causing immediate rash, facial/tongue/throat swelling, SOB or lightheadedness with hypotension:YES  Has patient had a PCN reaction causing severe rash involving mucus membranes or skin necrosis:No  Has patient had a PCN reaction that required hospitalization NO  Has patient had a PCN reaction occurring within the last 10 years: NO  If all of the above answers are "NO", then may proceed with Cephalosporin use.   Sulfa Antibiotics Rash    Review of Systems  Constitutional:  Negative for appetite change, chills, diaphoresis, fatigue and fever.  HENT:  Negative for congestion, ear pain, sinus pressure, sinus pain and sore throat.   Eyes: Negative.   Respiratory:  Negative for cough, chest tightness, shortness of breath and wheezing.   Cardiovascular:  Negative for chest pain and palpitations.  Gastrointestinal:  Negative for abdominal pain, constipation, diarrhea, nausea and vomiting.  Endocrine: Negative.   Genitourinary:  Negative for dysuria  and hematuria.  Musculoskeletal:  Negative for arthralgias, back pain, joint swelling and myalgias.  Skin:  Negative for rash and wound.       Left finger - red and swollen  Allergic/Immunologic: Negative.   Neurological:  Negative for dizziness, weakness and headaches.  Hematological: Negative.   Psychiatric/Behavioral:  Negative for dysphoric mood. The patient is not nervous/anxious.        Objective:         04/25/2024    2:43 PM 03/22/2024    8:39 AM 03/08/2024    8:38 AM  Vitals with BMI  Height 5\' 4"  5\' 4"  5\' 4"   Weight 311 lbs 6 oz 323 lbs 319 lbs  BMI 53.43 55.42 54.73  Systolic 130 130 161  Diastolic 78 80 62  Pulse 70 78 74    No data found.   Physical Exam Constitutional:      General: She is not in acute distress.    Appearance: Normal appearance. She is not ill-appearing.  Eyes:     Conjunctiva/sclera: Conjunctivae normal.  Neck:     Vascular: No carotid bruit.  Cardiovascular:     Rate and Rhythm: Normal rate and regular rhythm.     Heart sounds: Normal heart sounds. No murmur heard. Pulmonary:     Effort: Pulmonary effort is normal.     Breath sounds: Normal breath sounds. No wheezing.  Skin:    Findings: Erythema present.     Comments: See photo  Neurological:     Mental Status: She is alert. Mental status is at baseline.  Psychiatric:        Mood and Affect: Mood normal.        Behavior: Behavior normal.     Health Maintenance Due  Topic Date Due   HIV Screening  Never done    There are no preventive care reminders to display for this patient.   Lab Results  Component Value Date   TSH 4.110 10/24/2023   Lab Results  Component Value Date   WBC 15.0 (H) 01/31/2024   HGB 14.6 01/31/2024   HCT 44.7 01/31/2024   MCV 90 01/31/2024   PLT 403 01/31/2024   Lab Results  Component Value Date   NA 139 01/31/2024   K 5.1 01/31/2024   CO2 21 01/31/2024   GLUCOSE 82 01/31/2024   BUN 16 01/31/2024   CREATININE 0.67 01/31/2024   BILITOT  0.3 01/31/2024   ALKPHOS 69 01/31/2024   AST 22 01/31/2024   ALT 30 01/31/2024   PROT 7.6 01/31/2024   ALBUMIN 4.2 01/31/2024   CALCIUM 9.9 01/31/2024   ANIONGAP 13 01/06/2016   EGFR 118 01/31/2024   Lab Results  Component Value Date   CHOL 145 01/24/2024   Lab Results  Component Value Date   HDL 46 01/24/2024   Lab Results  Component Value Date   LDLCALC 84 01/24/2024   Lab Results  Component Value Date   TRIG 78 01/24/2024   Lab Results  Component Value Date   CHOLHDL 3.2 01/24/2024   Lab Results  Component Value Date   HGBA1C 5.6 10/18/2023   I & D  Date/Time: 04/25/2024 4:50 PM  Performed by: Janece Means, FNP Authorized by: Janece Means, FNP   Consent:    Consent obtained:  Verbal   Consent given by:  Patient   Risks, benefits, and alternatives were discussed: yes     Risks discussed:  Bleeding, infection and pain Location:    Type:  Abscess   Location: left finger. Pre-procedure details:    Skin preparation:  Alcohol Sedation:    Sedation type:  None Anesthesia:    Anesthesia method:  Local infiltration   Local anesthetic:  Lidocaine 2% w/o epi Procedure type:    Complexity:  Simple Procedure details:    Needle aspiration: yes     Needle gauge: 23G.   Incision types:  Stab incision   Scalpel blade:  11   Wound management:  Probed and deloculated   Drainage:  Bloody and purulent   Drainage amount:  Moderate   Wound treatment:  Wound left open   Packing material: telfa and large bandaid with triple antibiotic oitnment. Post-procedure details:    Procedure completion:  Tolerated well, no immediate  complications      Assessment & Plan:  Paronychia of finger, left Assessment & Plan: Acute paronychia of the left finger likely from aggressive manicuring. Incision and drainage recommended for symptom relief and culture. Discussed risks including infection and potential lack of pus. Cephalexin  prescribed cautiously due to penicillin  allergy. - Perform incision and drainage of the left finger paronychia. - Obtain culture of pus from incision site. - Administer digital block for pain management during procedure. - Prescribe Cephalexin , discontinue if rash or hives develop. - Advise use of antihistamines like Benadryl if allergic reaction occurs. - Instruct to apply pressure to incision site to manage bleeding. - Advise to avoid nail biting, aggressive manicuring, and picking hangnails.  Orders: -     Cephalexin ; Take 1 capsule (500 mg total) by mouth 2 (two) times daily for 5 days.  Dispense: 10 capsule; Refill: 0 -     WOUND CULTURE -     I & D  NASH (nonalcoholic steatohepatitis) Assessment & Plan: Fatty liver with family history of cirrhosis. Following Mediterranean diet with significant weight loss. Discussed potential use of Ozempic  pending insurance approval. - Submit request for Ozempic  approval for fatty liver treatment. - Continue Mediterranean diet and weight loss efforts.   Orders: -     Ozempic  (0.25 or 0.5 MG/DOSE); Inject 0.25 mg into the skin once a week.  Dispense: 3 mL; Refill: 0     Meds ordered this encounter  Medications   Semaglutide ,0.25 or 0.5MG /DOS, (OZEMPIC , 0.25 OR 0.5 MG/DOSE,) 2 MG/3ML SOPN    Sig: Inject 0.25 mg into the skin once a week.    Dispense:  3 mL    Refill:  0   cephALEXin  (KEFLEX ) 500 MG capsule    Sig: Take 1 capsule (500 mg total) by mouth 2 (two) times daily for 5 days.    Dispense:  10 capsule    Refill:  0    Orders Placed This Encounter  Procedures   I & D   WOUND CULTURE        Follow-up: Return in about 4 weeks (around 05/23/2024) for chronic.  An After Visit Summary was printed and given to the patient.  Delford Felling, FNP [facility] [phone]

## 2024-04-25 NOTE — Assessment & Plan Note (Signed)
 Acute paronychia of the left finger likely from aggressive manicuring. Incision and drainage recommended for symptom relief and culture. Discussed risks including infection and potential lack of pus. Cephalexin  prescribed cautiously due to penicillin allergy. - Perform incision and drainage of the left finger paronychia. - Obtain culture of pus from incision site. - Administer digital block for pain management during procedure. - Prescribe Cephalexin , discontinue if rash or hives develop. - Advise use of antihistamines like Benadryl if allergic reaction occurs. - Instruct to apply pressure to incision site to manage bleeding. - Advise to avoid nail biting, aggressive manicuring, and picking hangnails.

## 2024-04-29 ENCOUNTER — Ambulatory Visit: Payer: Self-pay | Admitting: Family Medicine

## 2024-04-29 LAB — WOUND CULTURE

## 2024-05-19 ENCOUNTER — Other Ambulatory Visit: Payer: Self-pay | Admitting: Family Medicine

## 2024-05-19 DIAGNOSIS — E282 Polycystic ovarian syndrome: Secondary | ICD-10-CM

## 2024-05-22 NOTE — Progress Notes (Signed)
 Subjective:  Patient ID: Kellie Foster, female    DOB: January 21, 1991  Age: 33 y.o. MRN: [id number]  Chief Complaint  Patient presents with   Medical Management of Chronic Issues    hypertension   Discussed the use of AI scribe software for clinical note transcription with the patient, who gave verbal consent to proceed.  History of Present Illness   Kellie Foster is a 33 year old female with fatty liver disease who presents for medical management of chronic issues.  She has a history of fatty liver disease and has been trying to contact her GI provider for follow-up scheduling issues. She attempted to email through MyChart but was unable to send a message. She has called twice and left messages without receiving a response.  She experiences symptoms of sleep apnea, including snoring, but has never had a sleep study. She mentioned having large tonsils and a deviated septum, which were previously evaluated but not surgically addressed.  She has been taking metformin  for PCOS and has noticed weight loss, which she attributes to the medication. She is unsure if her metformin  prescription has been refilled and has requested a refill.  She has been losing weight through diet and some exercise, including walking for about 25 minutes every other day. She is busy with work and wedding planning, which has impacted her exercise routine.  She had a previous finger infection that resolved after treatment with antibiotics, with no side effects reported.         05/23/2024    7:40 AM 01/31/2024   10:30 AM 01/24/2024    8:58 AM 10/17/2023    2:08 PM 05/31/2023    1:25 PM  Depression screen PHQ 2/9  Decreased Interest 0 0 0 0 0  Down, Depressed, Hopeless 0 0 0 0 0  PHQ - 2 Score 0 0 0 0 0  Altered sleeping 0  0 1   Tired, decreased energy 0  0 1   Change in appetite 0  0 1   Feeling bad or failure about yourself  0  0 0   Trouble concentrating 0  0 0   Moving slowly or fidgety/restless 0  0 0   Suicidal  thoughts 0  0 0   PHQ-9 Score 0  0 3   Difficult doing work/chores Not difficult at all  Not difficult at all Not difficult at all         05/23/2024    7:40 AM  Fall Risk   Falls in the past year? 0  Number falls in past yr: 0  Injury with Fall? 0  Risk for fall due to : No Fall Risks  Follow up Falls evaluation completed    Patient Care Team: Janece Means, FNP as PCP - General (Family Medicine)   Review of Systems  Constitutional:  Negative for chills, diaphoresis, fatigue and fever.  HENT:  Negative for congestion, ear pain and sinus pain.   Eyes: Negative.   Respiratory:  Negative for cough and shortness of breath.   Cardiovascular:  Negative for chest pain.  Gastrointestinal:  Negative for abdominal pain, constipation, nausea and vomiting.  Endocrine: Negative.   Genitourinary:  Negative for dysuria.  Musculoskeletal:  Negative for arthralgias.  Skin: Negative.   Allergic/Immunologic: Negative.   Neurological:  Negative for weakness and headaches.  Hematological: Negative.   Psychiatric/Behavioral:  Negative for dysphoric mood. The patient is not nervous/anxious.     Current Outpatient Medications on File Prior to Visit  Medication Sig Dispense Refill   cetirizine  (ZYRTEC  ALLERGY) 10 MG tablet Take 1 tablet (10 mg total) by mouth daily. 30 tablet 3   fluticasone  (FLONASE ) 50 MCG/ACT nasal spray Place 2 sprays into both nostrils daily. 15.8 mL 3   meloxicam  (MOBIC ) 15 MG tablet Take 1 tablet (15 mg total) by mouth daily. 30 tablet 0   metoprolol  tartrate (LOPRESSOR ) 50 MG tablet Take 1 tablet by mouth twice daily 180 tablet 0   montelukast  (SINGULAIR ) 10 MG tablet Take 1 tablet (10 mg total) by mouth at bedtime. 30 tablet 3   Norgestimate-Ethinyl Estradiol Triphasic (TRI-SPRINTEC) 0.18/0.215/0.25 MG-35 MCG tablet Take 1 tablet by mouth daily. 28 tablet 5   spironolactone  (ALDACTONE ) 25 MG tablet Take 1 tablet (25 mg total) by mouth daily. 90 tablet 3    Semaglutide ,0.25 or 0.5MG /DOS, (OZEMPIC , 0.25 OR 0.5 MG/DOSE,) 2 MG/3ML SOPN Inject 0.25 mg into the skin once a week. (Patient not taking: Reported on 05/23/2024) 3 mL 0   No current facility-administered medications on file prior to visit.   Past Medical History:  Diagnosis Date   Hypertension    Obesity    Recurrent UTI (urinary tract infection)    History reviewed. No pertinent surgical history.  Family History  Problem Relation Age of Onset   Hypertension Mother    Diabetes Mother    Kidney cancer Mother    Hypertension Father    Social History   Socioeconomic History   Marital status: Single    Spouse name: Not on file   Number of children: Not on file   Years of education: Not on file   Highest education level: Some college, no degree  Occupational History   Not on file  Tobacco Use   Smoking status: Never   Smokeless tobacco: Never   Tobacco comments:    2nd hand smoking exposure  Substance and Sexual Activity   Alcohol use: No   Drug use: No   Sexual activity: Not on file  Other Topics Concern   Not on file  Social History Narrative   Not on file   Social Drivers of Health   Financial Resource Strain: Low Risk  (05/19/2024)   Overall Financial Resource Strain (CARDIA)    Difficulty of Paying Living Expenses: Not hard at all  Food Insecurity: No Food Insecurity (05/19/2024)   Hunger Vital Sign    Worried About Running Out of Food in the Last Year: Never true    Ran Out of Food in the Last Year: Never true  Transportation Needs: No Transportation Needs (05/19/2024)   PRAPARE - Administrator, Civil Service (Medical): No    Lack of Transportation (Non-Medical): No  Physical Activity: Insufficiently Active (05/19/2024)   Exercise Vital Sign    Days of Exercise per Week: 5 days    Minutes of Exercise per Session: 20 min  Stress: No Stress Concern Present (05/19/2024)   [employer] of Occupational Health - Occupational Stress Questionnaire     Feeling of Stress : Not at all  Social Connections: Unknown (05/19/2024)   Social Connection and Isolation Panel    Frequency of Communication with Friends and Family: Once a week    Frequency of Social Gatherings with Friends and Family: Once a week    Attends Religious Services: Never    Database administrator or Organizations: No    Attends Banker Meetings: Never    Marital Status: Patient declined    Objective:  BP  130/64   Pulse 86   Temp 98.7 F (37.1 C) (Temporal)   Resp 16   Ht 5' 4 (1.626 m)   Wt (!) 309 lb (140.2 kg)   LMP 05/12/2024 (Exact Date)   SpO2 98%   BMI 53.04 kg/m      05/23/2024    7:34 AM 04/25/2024    2:43 PM 03/22/2024    8:39 AM  BP/Weight  Systolic BP 130 130 130  Diastolic BP 64 78 80  Wt. (Lbs) 309 311.4 323  BMI 53.04 kg/m2 53.45 kg/m2 55.44 kg/m2    Physical Exam Vitals reviewed.  Constitutional:      General: She is not in acute distress.    Appearance: Normal appearance. She is obese. She is not ill-appearing.   Eyes:     Conjunctiva/sclera: Conjunctivae normal.    Cardiovascular:     Rate and Rhythm: Normal rate and regular rhythm.     Heart sounds: Normal heart sounds. No murmur heard. Pulmonary:     Effort: Pulmonary effort is normal.     Breath sounds: Normal breath sounds. No wheezing.  Abdominal:     General: Bowel sounds are normal.   Musculoskeletal:        General: Normal range of motion.     Cervical back: Normal range of motion.   Skin:    General: Skin is warm.   Neurological:     Mental Status: She is alert. Mental status is at baseline.   Psychiatric:        Mood and Affect: Mood normal.        Behavior: Behavior normal.    Lab Results  Component Value Date   WBC 15.0 (H) 01/31/2024   HGB 14.6 01/31/2024   HCT 44.7 01/31/2024   PLT 403 01/31/2024   GLUCOSE 82 01/31/2024   CHOL 145 01/24/2024   TRIG 78 01/24/2024   HDL 46 01/24/2024   LDLCALC 84 01/24/2024   ALT 30 01/31/2024   AST  22 01/31/2024   NA 139 01/31/2024   K 5.1 01/31/2024   CL 102 01/31/2024   CREATININE 0.67 01/31/2024   BUN 16 01/31/2024   CO2 21 01/31/2024   TSH 4.110 10/24/2023   INR 1.1 (H) 03/22/2024   HGBA1C 5.6 10/18/2023      Assessment & Plan:  Primary hypertension Assessment & Plan: Well controlled with Lisinopril  and Metoprolol . BP Readings from Last 3 Encounters:  05/23/24 130/64  04/25/24 130/78  03/22/24 130/80    - Continue current medications. - Labs drawn today, Await labs/testing for assessment and recommendations   Orders: -     CBC with Differential/Platelet -     CMP14+EGFR  Elevated LDL cholesterol level Assessment & Plan: Labs drawn, Await labs/testing for assessment and recommendations  Lab Results  Component Value Date   LDLCALC 84 01/24/2024  - continue to work on diet and exercise - patient does not want to be on cholesterol medication.  Orders: -     Lipid panel  PCOS (polycystic ovarian syndrome) Assessment & Plan: PCOS managed with metformin . Uncertain about metformin  refill status. - Refill metformin  prescription at [facility] pharmacy.  Orders: -     metFORMIN  HCl; Take 1 tablet (500 mg total) by mouth daily with breakfast.  Dispense: 90 tablet; Refill: 1  NASH (nonalcoholic steatohepatitis) Assessment & Plan: Fatty liver disease persists. Medication approval attempts failed. She seeks to reschedule her appointment due to her wedding. - Send a message to her previous provider to  facilitate follow-up scheduling.   Morbid obesity with BMI of 50.0-59.9, adult (HCC) Assessment & Plan: BMI 53.04, Not at goal - unable to tolerate phentermine  and insurance will not cover weight loss medication. - Continue to work on diet and exercise    Orders: -     VITAMIN D 25 Hydroxy (Vit-D Deficiency, Fractures)  Encounter for screening for HIV -     HIV Antibody (routine testing w rflx)     Meds ordered this encounter  Medications   metFORMIN   (GLUCOPHAGE ) 500 MG tablet    Sig: Take 1 tablet (500 mg total) by mouth daily with breakfast.    Dispense:  90 tablet    Refill:  1    Orders Placed This Encounter  Procedures   CBC with Differential/Platelet   CMP14+EGFR   Lipid panel   HIV Antibody (routine testing w rflx)   Vitamin D, 25-hydroxy       Follow-up: Return in about 3 months (around 08/23/2024).   I,Angela Taylor,acting as a Neurosurgeon for Janece Means, FNP.,have documented all relevant documentation on the behalf of Janece Means, FNP,as directed by  Janece Means, FNP while in the presence of Janece Means, FNP.   An After Visit Summary was printed and given to the patient.  I attest that I have reviewed this visit and agree with the plan scribed by my staff.   Delford Felling, FNP [facility] [phone]

## 2024-05-23 ENCOUNTER — Ambulatory Visit: Payer: 59 | Admitting: Family Medicine

## 2024-05-23 ENCOUNTER — Encounter: Payer: Self-pay | Admitting: Family Medicine

## 2024-05-23 VITALS — BP 130/64 | HR 86 | Temp 98.7°F | Resp 16 | Ht 64.0 in | Wt 309.0 lb

## 2024-05-23 DIAGNOSIS — E7800 Pure hypercholesterolemia, unspecified: Secondary | ICD-10-CM

## 2024-05-23 DIAGNOSIS — E282 Polycystic ovarian syndrome: Secondary | ICD-10-CM

## 2024-05-23 DIAGNOSIS — I10 Essential (primary) hypertension: Secondary | ICD-10-CM

## 2024-05-23 DIAGNOSIS — K7581 Nonalcoholic steatohepatitis (NASH): Secondary | ICD-10-CM

## 2024-05-23 DIAGNOSIS — Z6841 Body Mass Index (BMI) 40.0 and over, adult: Secondary | ICD-10-CM

## 2024-05-23 DIAGNOSIS — Z114 Encounter for screening for human immunodeficiency virus [HIV]: Secondary | ICD-10-CM | POA: Insufficient documentation

## 2024-05-23 MED ORDER — METFORMIN HCL 500 MG PO TABS: 500.0000 mg | ORAL_TABLET | Freq: Every day | ORAL | 1 refills | Status: DC

## 2024-05-23 NOTE — Assessment & Plan Note (Signed)
 PCOS managed with metformin . Uncertain about metformin  refill status. - Refill metformin  prescription at [facility] pharmacy.

## 2024-05-23 NOTE — Assessment & Plan Note (Signed)
 Well controlled with Lisinopril  and Metoprolol . BP Readings from Last 3 Encounters:  05/23/24 130/64  04/25/24 130/78  03/22/24 130/80    - Continue current medications. - Labs drawn today, Await labs/testing for assessment and recommendations

## 2024-05-23 NOTE — Assessment & Plan Note (Signed)
 Labs drawn, Await labs/testing for assessment and recommendations  Lab Results  Component Value Date   LDLCALC 84 01/24/2024  - continue to work on diet and exercise - patient does not want to be on cholesterol medication.

## 2024-05-23 NOTE — Assessment & Plan Note (Signed)
 BMI 53.04, Not at goal - unable to tolerate phentermine  and insurance will not cover weight loss medication. - Continue to work on diet and exercise

## 2024-05-23 NOTE — Assessment & Plan Note (Signed)
 Fatty liver disease persists. Medication approval attempts failed. She seeks to reschedule her appointment due to her wedding. - Send a message to her previous provider to facilitate follow-up scheduling.

## 2024-05-24 LAB — CBC WITH DIFFERENTIAL/PLATELET
Basophils Absolute: 0.1 10*3/uL (ref 0.0–0.2)
Basos: 1 %
EOS (ABSOLUTE): 0.2 10*3/uL (ref 0.0–0.4)
Eos: 2 %
Hematocrit: 49.5 % — ABNORMAL HIGH (ref 34.0–46.6)
Hemoglobin: 15.4 g/dL (ref 11.1–15.9)
Immature Grans (Abs): 0 10*3/uL (ref 0.0–0.1)
Immature Granulocytes: 0 %
Lymphocytes Absolute: 2.2 10*3/uL (ref 0.7–3.1)
Lymphs: 24 %
MCH: 28.5 pg (ref 26.6–33.0)
MCHC: 31.1 g/dL — ABNORMAL LOW (ref 31.5–35.7)
MCV: 92 fL (ref 79–97)
Monocytes Absolute: 0.7 10*3/uL (ref 0.1–0.9)
Monocytes: 8 %
Neutrophils Absolute: 6 10*3/uL (ref 1.4–7.0)
Neutrophils: 65 %
Platelets: 373 10*3/uL (ref 150–450)
RBC: 5.4 x10E6/uL — ABNORMAL HIGH (ref 3.77–5.28)
RDW: 12 % (ref 11.7–15.4)
WBC: 9.2 10*3/uL (ref 3.4–10.8)

## 2024-05-24 LAB — CMP14+EGFR
ALT: 20 IU/L (ref 0–32)
AST: 20 IU/L (ref 0–40)
Albumin: 4.3 g/dL (ref 3.9–4.9)
Alkaline Phosphatase: 73 IU/L (ref 44–121)
BUN/Creatinine Ratio: 21 (ref 9–23)
BUN: 13 mg/dL (ref 6–20)
Bilirubin Total: 0.6 mg/dL (ref 0.0–1.2)
CO2: 20 mmol/L (ref 20–29)
Calcium: 9.3 mg/dL (ref 8.7–10.2)
Chloride: 101 mmol/L (ref 96–106)
Creatinine, Ser: 0.63 mg/dL (ref 0.57–1.00)
Globulin, Total: 3.1 g/dL (ref 1.5–4.5)
Glucose: 100 mg/dL — ABNORMAL HIGH (ref 70–99)
Potassium: 4.9 mmol/L (ref 3.5–5.2)
Sodium: 137 mmol/L (ref 134–144)
Total Protein: 7.4 g/dL (ref 6.0–8.5)
eGFR: 119 mL/min/{1.73_m2} (ref >59)

## 2024-05-24 LAB — VITAMIN D 25 HYDROXY (VIT D DEFICIENCY, FRACTURES): Vit D, 25-Hydroxy: 25.3 ng/mL — ABNORMAL LOW (ref 30.0–100.0)

## 2024-05-24 LAB — LIPID PANEL
Chol/HDL Ratio: 3.8 ratio (ref 0.0–4.4)
Cholesterol, Total: 173 mg/dL (ref 100–199)
HDL: 45 mg/dL (ref >39)
LDL Chol Calc (NIH): 118 mg/dL — ABNORMAL HIGH (ref 0–99)
Triglycerides: 52 mg/dL (ref 0–149)
VLDL Cholesterol Cal: 10 mg/dL (ref 5–40)

## 2024-05-24 LAB — HIV ANTIBODY (ROUTINE TESTING W REFLEX): HIV Screen 4th Generation wRfx: NONREACTIVE

## 2024-05-27 ENCOUNTER — Ambulatory Visit: Payer: Self-pay | Admitting: Family Medicine

## 2024-05-27 DIAGNOSIS — E559 Vitamin D deficiency, unspecified: Secondary | ICD-10-CM

## 2024-05-27 MED ORDER — VITAMIN D (ERGOCALCIFEROL) 1.25 MG (50000 UNIT) PO CAPS: 50000.0000 [IU] | ORAL_CAPSULE | ORAL | 0 refills | Status: AC

## 2024-06-20 ENCOUNTER — Encounter: Payer: Self-pay | Admitting: Family Medicine

## 2024-06-20 ENCOUNTER — Ambulatory Visit: Admitting: Family Medicine

## 2024-06-20 VITALS — BP 122/80 | HR 85 | Temp 97.8°F | Ht 64.0 in | Wt 307.0 lb

## 2024-06-20 DIAGNOSIS — G43109 Migraine with aura, not intractable, without status migrainosus: Secondary | ICD-10-CM | POA: Diagnosis not present

## 2024-06-20 DIAGNOSIS — J0110 Acute frontal sinusitis, unspecified: Secondary | ICD-10-CM | POA: Diagnosis not present

## 2024-06-20 MED ORDER — NURTEC 75 MG PO TBDP: 75.0000 mg | ORAL_TABLET | Freq: Every day | ORAL | Status: AC | PRN

## 2024-06-20 MED ORDER — AZITHROMYCIN 250 MG PO TABS: ORAL_TABLET | ORAL | 0 refills | Status: DC

## 2024-06-20 NOTE — Progress Notes (Signed)
 Subjective:  Patient ID: Kellie Foster, female    DOB: [birth year]  Age: 33 y.o. MRN: [id number]  Chief Complaint  Patient presents with   Migraine    Discussed the use of AI scribe software for clinical note transcription with the patient, who gave verbal consent to proceed.  History of Present Illness   The patient presents with migraine and sinus pain.  Sinusitis - Migraine onset Monday afternoon - Pain localized to frontal lobes, radiating to the neck - Most severe pain in the morning, persisting throughout the day - Pain begins in the frontal area and moves posteriorly - Associated nausea since onset - No relief with ibuprofen, Excedrin, or Zyrtec  - Facial pain and joint stiffness present - Discomfort with deep inspiration - No throat pain  Environmental and exposure history - Fianc experiencing similar symptoms - Fianc's uncle developed symptoms while traveling home  Medication and allergy history - Recent use of vitamin D  supplementation, previously associated with similar symptoms - Known allergy to penicillin - Tolerated Z-Pak previously without gastrointestinal side effects         05/23/2024    7:40 AM 01/31/2024   10:30 AM 01/24/2024    8:58 AM 10/17/2023    2:08 PM 05/31/2023    1:25 PM  Depression screen PHQ 2/9  Decreased Interest 0 0 0 0 0  Down, Depressed, Hopeless 0 0 0 0 0  PHQ - 2 Score 0 0 0 0 0  Altered sleeping 0  0 1   Tired, decreased energy 0  0 1   Change in appetite 0  0 1   Feeling bad or failure about yourself  0  0 0   Trouble concentrating 0  0 0   Moving slowly or fidgety/restless 0  0 0   Suicidal thoughts 0  0 0   PHQ-9 Score 0  0 3   Difficult doing work/chores Not difficult at all  Not difficult at all Not difficult at all         05/23/2024    7:40 AM  Fall Risk   Falls in the past year? 0  Number falls in past yr: 0  Injury with Fall? 0  Risk for fall due to : No Fall Risks  Follow up Falls evaluation completed     Patient Care Team: Teressa Harrie HERO, FNP as PCP - General (Family Medicine)   Review of Systems  Constitutional:  Negative for chills, fatigue and fever.  HENT:  Positive for sinus pain (frontal). Negative for congestion, ear pain, rhinorrhea and sore throat.   Respiratory:  Positive for cough. Negative for shortness of breath.   Cardiovascular:  Negative for chest pain.  Gastrointestinal:  Positive for nausea. Negative for abdominal pain, constipation, diarrhea and vomiting.  Genitourinary:  Negative for dysuria and urgency.  Musculoskeletal:  Positive for arthralgias. Negative for back pain and myalgias.  Neurological:  Positive for headaches. Negative for dizziness, weakness and light-headedness.  Psychiatric/Behavioral:  Negative for dysphoric mood. The patient is not nervous/anxious.     Current Outpatient Medications on File Prior to Visit  Medication Sig Dispense Refill   cetirizine  (ZYRTEC  ALLERGY) 10 MG tablet Take 1 tablet (10 mg total) by mouth daily. 30 tablet 3   fluticasone  (FLONASE ) 50 MCG/ACT nasal spray Place 2 sprays into both nostrils daily. 15.8 mL 3   meloxicam  (MOBIC ) 15 MG tablet Take 1 tablet (15 mg total) by mouth daily. 30 tablet 0   metFORMIN  (GLUCOPHAGE ) 500 MG  tablet Take 1 tablet (500 mg total) by mouth daily with breakfast. 90 tablet 1   metoprolol  tartrate (LOPRESSOR ) 50 MG tablet Take 1 tablet by mouth twice daily 180 tablet 0   montelukast  (SINGULAIR ) 10 MG tablet Take 1 tablet (10 mg total) by mouth at bedtime. 30 tablet 3   Norgestimate-Ethinyl Estradiol Triphasic (TRI-SPRINTEC) 0.18/0.215/0.25 MG-35 MCG tablet Take 1 tablet by mouth daily. 28 tablet 5   Semaglutide ,0.25 or 0.5MG /DOS, (OZEMPIC , 0.25 OR 0.5 MG/DOSE,) 2 MG/3ML SOPN Inject 0.25 mg into the skin once a week. (Patient not taking: Reported on 05/23/2024) 3 mL 0   spironolactone  (ALDACTONE ) 25 MG tablet Take 1 tablet (25 mg total) by mouth daily. 90 tablet 3   Vitamin D , Ergocalciferol ,  (DRISDOL ) 1.25 MG (50000 UNIT) CAPS capsule Take 1 capsule (50,000 Units total) by mouth every 7 (seven) days for 12 doses. 12 capsule 0   No current facility-administered medications on file prior to visit.   Past Medical History:  Diagnosis Date   Hypertension    Obesity    Recurrent UTI (urinary tract infection)    History reviewed. No pertinent surgical history.  Family History  Problem Relation Age of Onset   Hypertension Mother    Diabetes Mother    Kidney cancer Mother    Hypertension Father    Social History   Socioeconomic History   Marital status: Single    Spouse name: Not on file   Number of children: Not on file   Years of education: Not on file   Highest education level: Some college, no degree  Occupational History   Not on file  Tobacco Use   Smoking status: Never   Smokeless tobacco: Never   Tobacco comments:    2nd hand smoking exposure  Substance and Sexual Activity   Alcohol use: No   Drug use: No   Sexual activity: Not on file  Other Topics Concern   Not on file  Social History Narrative   Not on file   Social Drivers of Health   Financial Resource Strain: Low Risk  (05/19/2024)   Overall Financial Resource Strain (CARDIA)    Difficulty of Paying Living Expenses: Not hard at all  Food Insecurity: No Food Insecurity (05/19/2024)   Hunger Vital Sign    Worried About Running Out of Food in the Last Year: Never true    Ran Out of Food in the Last Year: Never true  Transportation Needs: No Transportation Needs (05/19/2024)   PRAPARE - Administrator, Civil Service (Medical): No    Lack of Transportation (Non-Medical): No  Physical Activity: Insufficiently Active (05/19/2024)   Exercise Vital Sign    Days of Exercise per Week: 5 days    Minutes of Exercise per Session: 20 min  Stress: No Stress Concern Present (05/19/2024)   [employer] of Occupational Health - Occupational Stress Questionnaire    Feeling of Stress : Not at all   Social Connections: Unknown (05/19/2024)   Social Connection and Isolation Panel    Frequency of Communication with Friends and Family: Once a week    Frequency of Social Gatherings with Friends and Family: Once a week    Attends Religious Services: Never    Database administrator or Organizations: No    Attends Banker Meetings: Never    Marital Status: Patient declined    Objective:  BP 122/80   Pulse 85   Temp 97.8 F (36.6 C)   Ht 5'  4 (1.626 m)   Wt (!) 307 lb (139.3 kg)   LMP 06/11/2024 (Exact Date)   SpO2 98%   BMI 52.70 kg/m      06/20/2024    7:54 AM 05/23/2024    7:34 AM 04/25/2024    2:43 PM  BP/Weight  Systolic BP 122 130 130  Diastolic BP 80 64 78  Wt. (Lbs) 307 309 311.4  BMI 52.7 kg/m2 53.04 kg/m2 53.45 kg/m2    Physical Exam Vitals reviewed.  Constitutional:      General: She is not in acute distress.    Appearance: Normal appearance. She is obese. She is ill-appearing.  HENT:     Head:     Jaw: Tenderness present.     Right Ear: Tympanic membrane, ear canal and external ear normal.     Left Ear: Tympanic membrane, ear canal and external ear normal.     Nose:     Right Sinus: Maxillary sinus tenderness and frontal sinus tenderness present.     Left Sinus: Maxillary sinus tenderness and frontal sinus tenderness present.     Mouth/Throat:     Pharynx: No posterior oropharyngeal erythema.  Eyes:     Conjunctiva/sclera: Conjunctivae normal.  Cardiovascular:     Rate and Rhythm: Normal rate and regular rhythm.     Heart sounds: Normal heart sounds. No murmur heard. Pulmonary:     Effort: Pulmonary effort is normal.     Breath sounds: Normal breath sounds. No wheezing.  Musculoskeletal:        General: Normal range of motion.  Skin:    General: Skin is warm.  Neurological:     Mental Status: She is alert. Mental status is at baseline.  Psychiatric:        Mood and Affect: Mood normal.        Behavior: Behavior normal.     Lab  Results  Component Value Date   WBC 9.2 05/23/2024   HGB 15.4 05/23/2024   HCT 49.5 (H) 05/23/2024   PLT 373 05/23/2024   GLUCOSE 100 (H) 05/23/2024   CHOL 173 05/23/2024   TRIG 52 05/23/2024   HDL 45 05/23/2024   LDLCALC 118 (H) 05/23/2024   ALT 20 05/23/2024   AST 20 05/23/2024   NA 137 05/23/2024   K 4.9 05/23/2024   CL 101 05/23/2024   CREATININE 0.63 05/23/2024   BUN 13 05/23/2024   CO2 20 05/23/2024   TSH 4.110 10/24/2023   INR 1.1 (H) 03/22/2024   HGBA1C 5.6 10/18/2023      Assessment & Plan:  Acute non-recurrent frontal sinusitis Assessment & Plan: Suspected sinus infection causing headache, facial pain and upper respiratory symptoms. Antibiotic therapy with Z-Pak considered for potential bacterial infection. - Prescribe Z-Pak (azithromycin ). - Provide sinus rinse for symptomatic relief.  - Increase fluids and rest - Provide note for work  Orders: -     Azithromycin ; 2 DAILY FOR FIRST DAY, THEN DECREASE TO ONE DAILY FOR 4 MORE DAYS.  Dispense: 6 tablet; Refill: 0  Migraine with aura and without status migrainosus, not intractable Assessment & Plan: Migraine with frontal and maxillary pain, possibly triggered by sinus infection or environmental factors. Over-the-counter medications ineffective. Nurtec recommended for rapid relief without drowsiness. - Provide Nurtec samples. - Prescribe Nurtec if effective    Orders: -     Nurtec; Take 1 tablet (75 mg total) by mouth daily as needed.     Meds ordered this encounter  Medications   azithromycin  (ZITHROMAX )  250 MG tablet    Sig: 2 DAILY FOR FIRST DAY, THEN DECREASE TO ONE DAILY FOR 4 MORE DAYS.    Dispense:  6 tablet    Refill:  0   Rimegepant Sulfate (NURTEC) 75 MG TBDP    Sig: Take 1 tablet (75 mg total) by mouth daily as needed.    No orders of the defined types were placed in this encounter.         Follow-up: Return in about 2 months (around 08/21/2024), or if symptoms worsen or fail to  improve, for chronic.   I,Katherina A Bramblett,acting as a scribe for Harrie CHRISTELLA Cedar, FNP.,have documented all relevant documentation on the behalf of Harrie CHRISTELLA Cedar, FNP,as directed by  Harrie CHRISTELLA Cedar, FNP while in the presence of Harrie CHRISTELLA Cedar, FNP.   An After Visit Summary was printed and given to the patient.  I attest that I have reviewed this visit and agree with the plan scribed by my staff.   Harrie CHRISTELLA Cedar, FNP [facility] [phone]

## 2024-06-20 NOTE — Assessment & Plan Note (Signed)
 Migraine with frontal and maxillary pain, possibly triggered by sinus infection or environmental factors. Over-the-counter medications ineffective. Nurtec recommended for rapid relief without drowsiness. - Provide Nurtec samples. - Prescribe Nurtec if effective

## 2024-06-20 NOTE — Assessment & Plan Note (Addendum)
 Suspected sinus infection causing headache, facial pain and upper respiratory symptoms. Antibiotic therapy with Z-Pak considered for potential bacterial infection. - Prescribe Z-Pak (azithromycin ). - Provide sinus rinse for symptomatic relief.  - Increase fluids and rest - Provide note for work

## 2024-07-23 ENCOUNTER — Ambulatory Visit: Admitting: Gastroenterology

## 2024-08-13 ENCOUNTER — Ambulatory Visit: Admitting: Gastroenterology

## 2024-08-19 ENCOUNTER — Encounter: Payer: Self-pay | Admitting: Gastroenterology

## 2024-08-19 ENCOUNTER — Other Ambulatory Visit (INDEPENDENT_AMBULATORY_CARE_PROVIDER_SITE_OTHER)

## 2024-08-19 ENCOUNTER — Ambulatory Visit: Admitting: Gastroenterology

## 2024-08-19 VITALS — BP 110/72 | HR 86 | Ht 64.0 in | Wt 314.0 lb

## 2024-08-19 DIAGNOSIS — K760 Fatty (change of) liver, not elsewhere classified: Secondary | ICD-10-CM

## 2024-08-19 DIAGNOSIS — K5790 Diverticulosis of intestine, part unspecified, without perforation or abscess without bleeding: Secondary | ICD-10-CM

## 2024-08-19 LAB — HEPATIC FUNCTION PANEL
ALT: 17 U/L (ref 0–35)
AST: 13 U/L (ref 0–37)
Albumin: 4.3 g/dL (ref 3.5–5.2)
Alkaline Phosphatase: 69 U/L (ref 39–117)
Bilirubin, Direct: 0.1 mg/dL (ref 0.0–0.3)
Total Bilirubin: 0.6 mg/dL (ref 0.2–1.2)
Total Protein: 8.2 g/dL (ref 6.0–8.3)

## 2024-08-19 NOTE — Patient Instructions (Signed)
 Your provider has requested that you go to the basement level for lab work before leaving today. Press B on the elevator. The lab is located at the first door on the left as you exit the elevator.  _______________________________________________________  If your blood pressure at your visit was 140/90 or greater, please contact your primary care physician to follow up on this.  _______________________________________________________  If you are age 34 or older, your body mass index should be between 23-30. Your Body mass index is 53.9 kg/m. If this is out of the aforementioned range listed, please consider follow up with your Primary Care Provider.  If you are age 37 or younger, your body mass index should be between 19-25. Your Body mass index is 53.9 kg/m. If this is out of the aformentioned range listed, please consider follow up with your Primary Care Provider.   ________________________________________________________  The  GI providers would like to encourage you to use MYCHART to communicate with providers for non-urgent requests or questions.  Due to long hold times on the telephone, sending your provider a message by [facility] may be a faster and more efficient way to get a response.  Please allow 48 business hours for a response.  Please remember that this is for non-urgent requests.  _______________________________________________________  [facility] is using a team-based approach to care.  Your team is made up of your doctor and two to three APPS. Our APPS (Nurse Practitioners and Physician Assistants) work with your physician to ensure care continuity for you. They are fully qualified to address your health concerns and develop a treatment plan. They communicate directly with your gastroenterologist to care for you. Seeing the Advanced Practice Practitioners on your physician's team can help you by facilitating care more promptly, often allowing for earlier  appointments, access to diagnostic testing, procedures, and other specialty referrals.   Thank you for trusting me with your gastrointestinal care. Deanna May, FNP-C

## 2024-08-19 NOTE — Progress Notes (Signed)
 Chief Complaint:CT showed diverticulosis and fatty liver  Primary GI Doctor:Dr. Charlanne  HPI:  Patient is a 33 year old female patient with past medical history of hypertension, PCOS, obesity, who was referred to me by Teressa Harrie HERO, FNP on 02/05/24 for a complaint of CT showed diverticulosis and fatty liver .    Interval History     Patient presents for follow-up on fatty liver and diverticulosis. Patient is getting married in October so she has been motivated to lose weight and started following Mediterranean diet. She has discussed with her PCP about GLP-1 but unfortunately wasn't covered at the time.     She denies abdominal pain, altered bowels habits, or blood in stool.   Patient reports her back pain since last visit has improved, she saw back specialist who noted lower back issues compressing on spine and prescribed meloxicam  which has helped.  No new issues.  Patient's family history includes mother with kidney CA. Father with diverticular disease.  Patients fathers mothers Aunt had cirrhosis.   Wt Readings from Last 3 Encounters:  08/19/24 (!) 314 lb (142.4 kg)  06/20/24 (!) 307 lb (139.3 kg)  05/23/24 (!) 309 lb (140.2 kg)    Past Medical History:  Diagnosis Date   Hypertension    Obesity    Recurrent UTI (urinary tract infection)    History reviewed. No pertinent surgical history.  Current Outpatient Medications  Medication Sig Dispense Refill   azithromycin  (ZITHROMAX ) 250 MG tablet 2 DAILY FOR FIRST DAY, THEN DECREASE TO ONE DAILY FOR 4 MORE DAYS. 6 tablet 0   cetirizine  (ZYRTEC  ALLERGY) 10 MG tablet Take 1 tablet (10 mg total) by mouth daily. 30 tablet 3   fluticasone  (FLONASE ) 50 MCG/ACT nasal spray Place 2 sprays into both nostrils daily. 15.8 mL 3   meloxicam  (MOBIC ) 15 MG tablet Take 1 tablet (15 mg total) by mouth daily. 30 tablet 0   metFORMIN  (GLUCOPHAGE ) 500 MG tablet Take 1 tablet (500 mg total) by mouth daily with breakfast. 90 tablet 1   metoprolol   tartrate (LOPRESSOR ) 50 MG tablet Take 1 tablet by mouth twice daily 180 tablet 0   montelukast  (SINGULAIR ) 10 MG tablet Take 1 tablet (10 mg total) by mouth at bedtime. 30 tablet 3   Norgestimate-Ethinyl Estradiol Triphasic (TRI-SPRINTEC) 0.18/0.215/0.25 MG-35 MCG tablet Take 1 tablet by mouth daily. 28 tablet 5   Rimegepant Sulfate (NURTEC) 75 MG TBDP Take 1 tablet (75 mg total) by mouth daily as needed.     spironolactone  (ALDACTONE ) 25 MG tablet Take 1 tablet (25 mg total) by mouth daily. 90 tablet 3   Semaglutide ,0.25 or 0.5MG /DOS, (OZEMPIC , 0.25 OR 0.5 MG/DOSE,) 2 MG/3ML SOPN Inject 0.25 mg into the skin once a week. (Patient not taking: Reported on 08/19/2024) 3 mL 0   No current facility-administered medications for this visit.    Allergies as of 08/19/2024 - Review Complete 08/19/2024  Allergen Reaction Noted   Ciprofloxacin  hcl Hives 09/12/2016   Penicillins Hives 01/29/2015   Sulfa antibiotics Rash 01/06/2016    Family History  Problem Relation Age of Onset   Hypertension Mother    Diabetes Mother    Kidney cancer Mother    Hypertension Father     Review of Systems:    Constitutional: No weight loss, fever, chills, weakness or fatigue HEENT: Eyes: No change in vision               Ears, Nose, Throat:  No change in hearing or congestion Skin:  No rash or itching Cardiovascular: No chest pain, chest pressure or palpitations   Respiratory: No SOB or cough Gastrointestinal: See HPI and otherwise negative Genitourinary: No dysuria or change in urinary frequency Neurological: No headache, dizziness or syncope Musculoskeletal: No new muscle or joint pain Hematologic: No bleeding or bruising Psychiatric: No history of depression or anxiety    Physical Exam:  Vital signs: BP 110/72   Pulse 86   Ht 5' 4 (1.626 m)   Wt (!) 314 lb (142.4 kg)   BMI 53.90 kg/m   Constitutional:  Pleasant  female appears to be in NAD, Well developed, Well nourished, alert and  cooperative Throat: Oral cavity and pharynx without inflammation, swelling or lesion.  Respiratory: Respirations even and unlabored. Lungs clear to auscultation bilaterally.   No wheezes, crackles, or rhonchi.  Cardiovascular: Normal S1, S2. Regular rate and rhythm. No peripheral edema, cyanosis or pallor.  Gastrointestinal:  Soft, nondistended, nontender. Obese. No rebound or guarding. Normal bowel sounds. No appreciable masses or hepatomegaly. Rectal:  Not performed.  Msk:  Symmetrical without gross deformities. Without edema, no deformity or joint abnormality.  Neurologic:  Alert and  oriented x4;  grossly normal neurologically.  Skin:   Dry and intact without significant lesions or rashes. Psychiatric: Oriented to person, place and time. Demonstrates good judgement and reason without abnormal affect or behaviors.  RELEVANT LABS AND IMAGING: CBC    Latest Ref Rng & Units 05/23/2024    8:18 AM 01/31/2024   11:02 AM 01/24/2024    9:25 AM  CBC  WBC 3.4 - 10.8 x10E3/uL 9.2  15.0  15.1   Hemoglobin 11.1 - 15.9 g/dL 84.5  85.3  85.0   Hematocrit 34.0 - 46.6 % 49.5  44.7  45.4   Platelets 150 - 450 x10E3/uL 373  403  396      CMP     Latest Ref Rng & Units 05/23/2024    8:18 AM 01/31/2024   11:02 AM 01/24/2024    9:25 AM  CMP  Glucose 70 - 99 mg/dL 899  82  91   BUN 6 - 20 mg/dL 13  16  17    Creatinine 0.57 - 1.00 mg/dL 9.36  9.32  9.28   Sodium 134 - 144 mmol/L 137  139  140   Potassium 3.5 - 5.2 mmol/L 4.9  5.1  4.6   Chloride 96 - 106 mmol/L 101  102  102   CO2 20 - 29 mmol/L 20  21  21    Calcium 8.7 - 10.2 mg/dL 9.3  9.9  9.7   Total Protein 6.0 - 8.5 g/dL 7.4  7.6  7.5   Total Bilirubin 0.0 - 1.2 mg/dL 0.6  0.3  0.4   Alkaline Phos 44 - 121 IU/L 73  69  72   AST 0 - 40 IU/L 20  22  20    ALT 0 - 32 IU/L 20  30  26       Lab Results  Component Value Date   TSH 4.110 10/24/2023   03/22/24 labs show:  ANA- positive, AMA- <20, Anti-smooth muscle antibody <20, Hepatitis A IgG-  non reactive,Hepatitis B surface antigen- non reactive, Hepatitis B surface antibody- non reactive, HCV antibody- non reactive, ferritin-67.9, TIBC-330.4,iron-71,  Alpha 1 antitrypsin-156, ceruloplasmin-29, tTG, total IgA-470, PT/INR- 11.4/1.1, IgG-1341  03/27/24 US  elastography liver IMPRESSION: ULTRASOUND LIVER:   Fatty liver infiltration.   ULTRASOUND HEPATIC ELASTOGRAPHY:   Median kPa:  7.1  02/02/24 CT ABD/pelvis IMPRESSION: 1. No  acute abnormality in the abdomen or pelvis. Specifically, no evidence of obstructive uropathy. 2. Diffuse hepatic steatosis. 3. Left-sided colonic diverticulosis without evidence of acute diverticulitis. 4. 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 01/29/15 CT Renal stone IMPRESSION: Mild to moderate right hydronephrosis due to a 1-2 mm proximal right ureteral stone. No other urinary tract stones are identified. Fatty infiltration of the liver. Mild diverticulosis without diverticulitis. Bilateral L5 pars interarticularis defects without anterolisthesis L5 on S1.  Fibrosis 4 Score = .41  Fib-4 interpretation is not validated for people under 35 or over 50 years of age. However, scores under 2.0 are generally considered low risk.   Assessment: Encounter Diagnoses  Name Primary?   Fatty liver Yes   Diverticulosis    33 year old female patient with fatty liver disease, median kPa score 7.1 on elastography.  Negative viral hepatitis workup.  Positive ANA however negative anti-smooth muscle and IgG.  Alpha 1 antitrypsin negative, ceruloplasmin negative, AMA negative.  IgA slightly elevated with no GI complaints, patient did not wish to pursue.  Slightly elevated INR will recheck today.  Normal PT. we did discuss how GLP-1 Ozempic  has been approved for fatty liver disease and she should discuss with her PCP to see if it is covered.  We also discussed continuing the Mediterranean diet and physical exercise as tolerated. She does not consume  alcohol. Normal LFT's, PLT 396.  Will recheck hepatic function today.  Plan to order follow-up ultrasound in 1 to 2 years.  Follow-up in 6 months.  Plan: -- Check INR/PT, hepatic panel function today --Order Abd U/S 1 year (03/2025) - Educated on dietary changes, weight loss, and physical exercise as tolerated - discuss GLP -1 with your PCP  Thank you for the courtesy of this consult. Please call me with any questions or concerns.   Anetria Harwick, FNP-C [facility] 08/19/2024, 11:38 AM  Cc: Teressa Harrie HERO, FNP

## 2024-08-21 ENCOUNTER — Ambulatory Visit: Admitting: Family Medicine

## 2024-08-21 ENCOUNTER — Encounter: Payer: Self-pay | Admitting: Family Medicine

## 2024-08-21 VITALS — BP 122/74 | HR 66 | Temp 97.4°F | Ht 64.0 in | Wt 311.0 lb

## 2024-08-21 DIAGNOSIS — J029 Acute pharyngitis, unspecified: Secondary | ICD-10-CM

## 2024-08-21 DIAGNOSIS — I10 Essential (primary) hypertension: Secondary | ICD-10-CM

## 2024-08-21 DIAGNOSIS — J3089 Other allergic rhinitis: Secondary | ICD-10-CM

## 2024-08-21 DIAGNOSIS — H6991 Unspecified Eustachian tube disorder, right ear: Secondary | ICD-10-CM | POA: Insufficient documentation

## 2024-08-21 DIAGNOSIS — E559 Vitamin D deficiency, unspecified: Secondary | ICD-10-CM | POA: Insufficient documentation

## 2024-08-21 DIAGNOSIS — E282 Polycystic ovarian syndrome: Secondary | ICD-10-CM

## 2024-08-21 DIAGNOSIS — Z6841 Body Mass Index (BMI) 40.0 and over, adult: Secondary | ICD-10-CM

## 2024-08-21 DIAGNOSIS — J020 Streptococcal pharyngitis: Secondary | ICD-10-CM | POA: Diagnosis not present

## 2024-08-21 DIAGNOSIS — K7581 Nonalcoholic steatohepatitis (NASH): Secondary | ICD-10-CM | POA: Diagnosis not present

## 2024-08-21 LAB — PROTIME-INR
INR: 0.9 ratio (ref 0.8–1.0)
Prothrombin Time: 9.9 s (ref 9.6–13.1)

## 2024-08-21 LAB — POC COVID19 BINAXNOW: SARS Coronavirus 2 Ag: NEGATIVE

## 2024-08-21 LAB — POCT RAPID STREP A (OFFICE): Rapid Strep A Screen: POSITIVE — AB

## 2024-08-21 MED ORDER — VITAMIN D (ERGOCALCIFEROL) 1.25 MG (50000 UNIT) PO CAPS: 50000.0000 [IU] | ORAL_CAPSULE | ORAL | 0 refills | Status: DC

## 2024-08-21 MED ORDER — FLUTICASONE PROPIONATE 50 MCG/ACT NA SUSP: 2.0000 | Freq: Every day | NASAL | 3 refills | Status: AC

## 2024-08-21 MED ORDER — METFORMIN HCL 500 MG PO TABS: 500.0000 mg | ORAL_TABLET | Freq: Every day | ORAL | 1 refills | Status: DC

## 2024-08-21 MED ORDER — AZITHROMYCIN 250 MG PO TABS: ORAL_TABLET | ORAL | 0 refills | Status: AC

## 2024-08-21 MED ORDER — MONTELUKAST SODIUM 10 MG PO TABS
10.0000 mg | ORAL_TABLET | Freq: Every day | ORAL | 3 refills | Status: AC
Start: 2024-08-21 — End: ?

## 2024-08-21 MED ORDER — CETIRIZINE HCL 10 MG PO TABS: 10.0000 mg | ORAL_TABLET | Freq: Every day | ORAL | Status: AC

## 2024-08-21 MED ORDER — METOPROLOL TARTRATE 50 MG PO TABS: 50.0000 mg | ORAL_TABLET | Freq: Two times a day (BID) | ORAL | 0 refills | Status: DC

## 2024-08-21 NOTE — Assessment & Plan Note (Signed)
Stable.  Refill sent to pharmacy.

## 2024-08-21 NOTE — Assessment & Plan Note (Signed)
 Well controlled with Lisinopril  and Metoprolol . BP Readings from Last 3 Encounters:  08/21/24 122/74  08/19/24 110/72  06/20/24 122/80    - Continue current medications. - Labs drawn today, Await labs/testing for assessment and recommendations

## 2024-08-21 NOTE — Assessment & Plan Note (Signed)
 Strep test - positive, Flu and Covid - negative Tonsils swollen and erythematous on assessment, sore since Monday Allergic to penicillin, sulfa, and ciprofloxacin . Azithromycin  suitable alternative. - Prescribed azithromycin  (Z-Pak) and sent prescription to [facility] pharmacy. - Try honey and lemon for sore throat - Gargle with warm salt water - Try hot tea to soothe the throat

## 2024-08-21 NOTE — Assessment & Plan Note (Signed)
 BMI 53.38, Not at goal - unable to tolerate phentermine  and insurance will not cover weight loss medication. - Continue to work on diet and exercise

## 2024-08-21 NOTE — Assessment & Plan Note (Signed)
 Fatty liver disease Discussed Ozempic  insurance coverage with GI doctor. FibroScan required for approval. - Liver US  confirmed MASH - Order FibroScan. - Submit documentation for Ozempic  insurance approval post-FibroScan.

## 2024-08-21 NOTE — Assessment & Plan Note (Signed)
 Stable Continue medications as prescribed (Flonase, Cetirizine and Singulair)

## 2024-08-21 NOTE — Assessment & Plan Note (Signed)
 PCOS managed with metformin . Uncertain about metformin  refill status. - Refill metformin  prescription at [facility] pharmacy.

## 2024-08-21 NOTE — Patient Instructions (Addendum)
  VISIT SUMMARY: Today, you were seen for a sore throat that has been bothering you since Monday. We discussed your symptoms, reviewed your medications, and addressed several ongoing health issues.  YOUR PLAN: -ACUTE PHARYNGITIS: Acute pharyngitis is an inflammation of the throat, often causing pain and discomfort. Given your allergies, we prescribed azithromycin  (Z-Pak) and sent the prescription to [facility] pharmacy.  -FATTY LIVER DISEASE: Fatty liver disease is a condition where fat builds up in the liver. We discussed the need for a FibroScan to get insurance approval for Ozempic . We will order the FibroScan and submit the necessary documentation after the scan.  - PCOS:  We have refilled your metformin  prescription to help manage your PCOS symptoms  -HYPERTENSION: Hypertension, or high blood pressure, is a condition where the force of the blood against your artery walls is too high. We have refilled your metoprolol  prescription to help control your blood pressure.  -HYPERCHOLESTEROLEMIA: Hypercholesterolemia is a condition characterized by high levels of cholesterol in the blood. We will order a cholesterol level test to monitor your cholesterol.  -VITAMIN D  DEFICIENCY: Vitamin D  deficiency means you have lower than normal levels of vitamin D  in your blood. We will order a vitamin D  level test to monitor your vitamin D  levels.  INSTRUCTIONS: Please follow up with the FibroScan as soon as it is scheduled. Continue taking your medications as prescribed. We will monitor your cholesterol and vitamin D  levels with the tests we ordered. If you have any new symptoms or concerns, please contact our office.                      Contains text generated by [employer]bridge.                                 Contains text generated by [employer]bridge.

## 2024-08-21 NOTE — Assessment & Plan Note (Signed)
 Vitamin D  deficiency Vitamin D  levels require monitoring. - Order vitamin D  level. - Refill Vitamin D  50,000 units every week

## 2024-08-21 NOTE — Assessment & Plan Note (Signed)
 Strep pharyngitis Allergic to penicillin, sulfa, and ciprofloxacin . Azithromycin  suitable alternative. - Prescribed azithromycin  (Z-Pak) and sent prescription to [facility] pharmacy. - Increase fluid intake and throw away toothbrush after treated

## 2024-08-21 NOTE — Progress Notes (Signed)
 Acute Office Visit  Subjective:    Patient ID: Kellie Foster, female    DOB: 1991/01/17, 33 y.o.   MRN: [id number]  Chief Complaint  Patient presents with   Sore Throat   Discussed the use of AI scribe software for clinical note transcription with the patient, who gave verbal consent to proceed.  History of Present Illness   Kellie Foster is a 33 year old female who presents with a sore throat.  She has been experiencing a sore throat since Monday, with the most severe symptoms occurring on that day. The sore throat is primarily felt in the morning and at night when preparing for bed. She noticed white spots on her throat. No ear pain, shortness of breath, or chest pain.  She has a known allergy to penicillin, sulfa, and Cipro , which causes immediate rash, facial, tongue, and throat swelling, shortness of breath, and lightheadedness. She tolerates azithromycin  (Z-Pak) well and prefers it due to fewer pills required.  She is currently taking several medications including Zyrtec , Flonase , meloxicam , metformin , metoprolol , Singulair , birth control pills, and spironolactone . She also takes vitamin D  due to low levels.  Her partner's grandmother, who she lived with and cared for, recently passed away from vulva cancer that recurred aggressively after initial treatment. She is engaged and preparing for her wedding, which is planned for Halloween.       Past Medical History:  Diagnosis Date   Hypertension    Obesity    Recurrent UTI (urinary tract infection)     History reviewed. No pertinent surgical history.  Family History  Problem Relation Age of Onset   Hypertension Mother    Diabetes Mother    Kidney cancer Mother    Hypertension Father     Social History   Socioeconomic History   Marital status: Single    Spouse name: Not on file   Number of children: Not on file   Years of education: Not on file   Highest education level: Associate degree: occupational, Scientist, product/process development, or  vocational program  Occupational History   Not on file  Tobacco Use   Smoking status: Never   Smokeless tobacco: Never   Tobacco comments:    2nd hand smoking exposure  Substance and Sexual Activity   Alcohol use: No   Drug use: No   Sexual activity: Not on file  Other Topics Concern   Not on file  Social History Narrative   Not on file   Social Drivers of Health   Financial Resource Strain: Low Risk  (08/20/2024)   Overall Financial Resource Strain (CARDIA)    Difficulty of Paying Living Expenses: Not hard at all  Food Insecurity: No Food Insecurity (08/20/2024)   Hunger Vital Sign    Worried About Running Out of Food in the Last Year: Never true    Ran Out of Food in the Last Year: Never true  Transportation Needs: No Transportation Needs (08/20/2024)   PRAPARE - Administrator, Civil Service (Medical): No    Lack of Transportation (Non-Medical): No  Physical Activity: Insufficiently Active (08/20/2024)   Exercise Vital Sign    Days of Exercise per Week: 4 days    Minutes of Exercise per Session: 30 min  Stress: No Stress Concern Present (08/20/2024)   [employer] of Occupational Health - Occupational Stress Questionnaire    Feeling of Stress: Not at all  Social Connections: Moderately Isolated (08/20/2024)   Social Connection and Isolation Panel    Frequency of  Communication with Friends and Family: More than three times a week    Frequency of Social Gatherings with Friends and Family: Once a week    Attends Religious Services: Never    Database administrator or Organizations: No    Attends Engineer, structural: Not on file    Marital Status: Living with partner  Intimate Partner Violence: Not At Risk (06/20/2024)   Humiliation, Afraid, Rape, and Kick questionnaire    Fear of Current or Ex-Partner: No    Emotionally Abused: No    Physically Abused: No    Sexually Abused: No    Outpatient Medications Prior to Visit  Medication Sig Dispense Refill    meloxicam  (MOBIC ) 15 MG tablet Take 1 tablet (15 mg total) by mouth daily. 30 tablet 0   Norgestimate-Ethinyl Estradiol Triphasic (TRI-SPRINTEC) 0.18/0.215/0.25 MG-35 MCG tablet Take 1 tablet by mouth daily. 28 tablet 5   Rimegepant Sulfate (NURTEC) 75 MG TBDP Take 1 tablet (75 mg total) by mouth daily as needed.     Semaglutide ,0.25 or 0.5MG /DOS, (OZEMPIC , 0.25 OR 0.5 MG/DOSE,) 2 MG/3ML SOPN Inject 0.25 mg into the skin once a week. 3 mL 0   spironolactone  (ALDACTONE ) 25 MG tablet Take 1 tablet (25 mg total) by mouth daily. 90 tablet 3   cetirizine  (ZYRTEC  ALLERGY) 10 MG tablet Take 1 tablet (10 mg total) by mouth daily. 30 tablet 3   fluticasone  (FLONASE ) 50 MCG/ACT nasal spray Place 2 sprays into both nostrils daily. 15.8 mL 3   metFORMIN  (GLUCOPHAGE ) 500 MG tablet Take 1 tablet (500 mg total) by mouth daily with breakfast. 90 tablet 1   metoprolol  tartrate (LOPRESSOR ) 50 MG tablet Take 1 tablet by mouth twice daily 180 tablet 0   montelukast  (SINGULAIR ) 10 MG tablet Take 1 tablet (10 mg total) by mouth at bedtime. 30 tablet 3   azithromycin  (ZITHROMAX ) 250 MG tablet 2 DAILY FOR FIRST DAY, THEN DECREASE TO ONE DAILY FOR 4 MORE DAYS. 6 tablet 0   No facility-administered medications prior to visit.    Allergies  Allergen Reactions   Ciprofloxacin  Hcl Hives   Penicillins Hives    Has patient had a PCN reaction causing immediate rash, facial/tongue/throat swelling, SOB or lightheadedness with hypotension:YES  Has patient had a PCN reaction causing severe rash involving mucus membranes or skin necrosis:No  Has patient had a PCN reaction that required hospitalization NO  Has patient had a PCN reaction occurring within the last 10 years: NO  If all of the above answers are NO, then may proceed with Cephalosporin use.   Sulfa Antibiotics Rash    Review of Systems  Constitutional:  Negative for appetite change, fatigue and fever.  HENT:  Positive for sore throat. Negative for  congestion, ear pain and sinus pressure.   Respiratory:  Negative for cough, chest tightness, shortness of breath and wheezing.   Cardiovascular:  Negative for chest pain and palpitations.  Gastrointestinal:  Negative for abdominal pain, constipation, diarrhea, nausea and vomiting.  Genitourinary:  Negative for dysuria and hematuria.  Musculoskeletal:  Negative for arthralgias, back pain, joint swelling and myalgias.  Skin:  Negative for rash.  Neurological:  Negative for dizziness, weakness and headaches.  Psychiatric/Behavioral:  Negative for dysphoric mood. The patient is not nervous/anxious.        Objective:        08/21/2024    3:31 PM 08/19/2024    8:44 AM 06/20/2024    7:54 AM  Vitals with BMI  Height  5' 4 5' 4 5' 4  Weight 311 lbs 314 lbs 307 lbs  BMI 53.36 53.87 52.67  Systolic 122 110 877  Diastolic 74 72 80  Pulse 66 86 85    Orthostatic VS for the past 72 hrs (Last 3 readings):  Patient Position BP Location  08/21/24 1531 Sitting Left Arm     Physical Exam Vitals reviewed.  Constitutional:      General: She is not in acute distress.    Appearance: Normal appearance.  HENT:     Right Ear: Tympanic membrane normal.     Left Ear: Tympanic membrane is erythematous.     Mouth/Throat:     Pharynx: Pharyngeal swelling and posterior oropharyngeal erythema present.     Tonsils: No tonsillar exudate or tonsillar abscesses. 3+ on the right. 3+ on the left.  Eyes:     Conjunctiva/sclera: Conjunctivae normal.  Neck:     Vascular: No carotid bruit.  Cardiovascular:     Rate and Rhythm: Normal rate and regular rhythm.     Heart sounds: Normal heart sounds.  Pulmonary:     Effort: Pulmonary effort is normal.     Breath sounds: Normal breath sounds.  Abdominal:     General: Bowel sounds are normal.     Palpations: Abdomen is soft.     Tenderness: There is no abdominal tenderness.  Neurological:     Mental Status: She is alert. Mental status is at baseline.   Psychiatric:        Mood and Affect: Mood normal.        Behavior: Behavior normal.     Health Maintenance Due  Topic Date Due   HPV VACCINES (1 - 3-dose SCDM series) Never done   Influenza Vaccine  07/12/2024       Topic Date Due   HPV VACCINES (1 - 3-dose SCDM series) Never done     Lab Results  Component Value Date   TSH 4.110 10/24/2023   Lab Results  Component Value Date   WBC 9.2 05/23/2024   HGB 15.4 05/23/2024   HCT 49.5 (H) 05/23/2024   MCV 92 05/23/2024   PLT 373 05/23/2024   Lab Results  Component Value Date   NA 137 05/23/2024   K 4.9 05/23/2024   CO2 20 05/23/2024   GLUCOSE 100 (H) 05/23/2024   BUN 13 05/23/2024   CREATININE 0.63 05/23/2024   BILITOT 0.6 08/19/2024   ALKPHOS 69 08/19/2024   AST 13 08/19/2024   ALT 17 08/19/2024   PROT 8.2 08/19/2024   ALBUMIN 4.3 08/19/2024   CALCIUM 9.3 05/23/2024   ANIONGAP 13 01/06/2016   EGFR 119 05/23/2024   Lab Results  Component Value Date   CHOL 173 05/23/2024   Lab Results  Component Value Date   HDL 45 05/23/2024   Lab Results  Component Value Date   LDLCALC 118 (H) 05/23/2024   Lab Results  Component Value Date   TRIG 52 05/23/2024   Lab Results  Component Value Date   CHOLHDL 3.8 05/23/2024   Lab Results  Component Value Date   HGBA1C 5.6 10/18/2023       Assessment & Plan:  Strep pharyngitis Assessment & Plan: Strep pharyngitis Allergic to penicillin, sulfa, and ciprofloxacin . Azithromycin  suitable alternative. - Prescribed azithromycin  (Z-Pak) and sent prescription to [facility] pharmacy. - Increase fluid intake and throw away toothbrush after treated  Orders: -     Azithromycin ; Take 2 tablets on day 1, then 1 tablet daily on  days 2 through 5  Dispense: 6 tablet; Refill: 0  Sore throat Assessment & Plan: Strep test - positive, Flu and Covid - negative Tonsils swollen and erythematous on assessment, sore since Monday Allergic to penicillin, sulfa, and ciprofloxacin .  Azithromycin  suitable alternative. - Prescribed azithromycin  (Z-Pak) and sent prescription to [facility] pharmacy. - Try honey and lemon for sore throat - Gargle with warm salt water - Try hot tea to soothe the throat   Orders: -     POCT rapid strep A -     POC COVID-19 BinaxNow  Primary hypertension Assessment & Plan: Well controlled with Lisinopril  and Metoprolol . BP Readings from Last 3 Encounters:  08/21/24 122/74  08/19/24 110/72  06/20/24 122/80    - Continue current medications. - Labs drawn today, Await labs/testing for assessment and recommendations   Orders: -     Metoprolol  Tartrate; Take 1 tablet (50 mg total) by mouth 2 (two) times daily.  Dispense: 180 tablet; Refill: 0  Metabolic dysfunction-associated steatohepatitis (MASH) Assessment & Plan: Fatty liver disease Discussed Ozempic  insurance coverage with GI doctor. FibroScan required for approval. - Liver US  confirmed MASH - Order FibroScan. - Submit documentation for Ozempic  insurance approval post-FibroScan.  Orders: -     Comprehensive metabolic panel with GFR -     Lipid panel -     FIB-4 w/Rx NASH FibroSure Plus  PCOS (polycystic ovarian syndrome) Assessment & Plan: PCOS managed with metformin . Uncertain about metformin  refill status. - Refill metformin  prescription at [facility] pharmacy.  Orders: -     metFORMIN  HCl; Take 1 tablet (500 mg total) by mouth daily with breakfast.  Dispense: 90 tablet; Refill: 1  Seasonal allergic rhinitis due to other allergic trigger Assessment & Plan: Stable Continue medications as prescribed (Flonase , Cetirizine  and Singulair )  Orders: -     Cetirizine  HCl; Take 1 tablet (10 mg total) by mouth daily. -     Montelukast  Sodium; Take 1 tablet (10 mg total) by mouth at bedtime.  Dispense: 30 tablet; Refill: 3 -     Fluticasone  Propionate; Place 2 sprays into both nostrils daily.  Dispense: 15.8 mL; Refill: 3  Dysfunction of right eustachian tube Assessment &  Plan: Stable - Refill sent to pharmacy  Orders: -     Fluticasone  Propionate; Place 2 sprays into both nostrils daily.  Dispense: 15.8 mL; Refill: 3  Vitamin D  deficiency Assessment & Plan: Vitamin D  deficiency Vitamin D  levels require monitoring. - Order vitamin D  level. - Refill Vitamin D  50,000 units every week  Orders: -     Vitamin D  (Ergocalciferol ); Take 1 capsule (50,000 Units total) by mouth every 7 (seven) days for 12 doses.  Dispense: 12 capsule; Refill: 0 -     VITAMIN D  25 Hydroxy (Vit-D Deficiency, Fractures)  Morbid obesity with BMI of 50.0-59.9, adult (HCC) Assessment & Plan: BMI 53.38, Not at goal - unable to tolerate phentermine  and insurance will not cover weight loss medication. - Continue to work on diet and exercise      Meds ordered this encounter  Medications   azithromycin  (ZITHROMAX ) 250 MG tablet    Sig: Take 2 tablets on day 1, then 1 tablet daily on days 2 through 5    Dispense:  6 tablet    Refill:  0   metoprolol  tartrate (LOPRESSOR ) 50 MG tablet    Sig: Take 1 tablet (50 mg total) by mouth 2 (two) times daily.    Dispense:  180 tablet    Refill:  0   cetirizine  (ZYRTEC  ALLERGY) 10 MG tablet    Sig: Take 1 tablet (10 mg total) by mouth daily.   montelukast  (SINGULAIR ) 10 MG tablet    Sig: Take 1 tablet (10 mg total) by mouth at bedtime.    Dispense:  30 tablet    Refill:  3   metFORMIN  (GLUCOPHAGE ) 500 MG tablet    Sig: Take 1 tablet (500 mg total) by mouth daily with breakfast.    Dispense:  90 tablet    Refill:  1   fluticasone  (FLONASE ) 50 MCG/ACT nasal spray    Sig: Place 2 sprays into both nostrils daily.    Dispense:  15.8 mL    Refill:  3   Vitamin D , Ergocalciferol , (DRISDOL ) 1.25 MG (50000 UNIT) CAPS capsule    Sig: Take 1 capsule (50,000 Units total) by mouth every 7 (seven) days for 12 doses.    Dispense:  12 capsule    Refill:  0    Orders Placed This Encounter  Procedures   Comprehensive metabolic panel with GFR    Vitamin D , 25-hydroxy   Lipid Panel   FIB-4 w/Rx NASH FibroSure Plus   POCT rapid strep A   POC COVID-19 BinaxNow     Follow-up: Return in about 3 months (around 11/20/2024) for chronic, lab visit, fasting.  An After Visit Summary was printed and given to the patient.   I,Lauren M Auman,acting as a scribe for Kellie CHRISTELLA Cedar, FNP.,have documented all relevant documentation on the behalf of Kellie CHRISTELLA Cedar, FNP,as directed by  Kellie CHRISTELLA Cedar, FNP while in the presence of Kellie CHRISTELLA Cedar, FNP.   I attest that I have reviewed this visit and agree with the plan scribed by my staff.   Kellie CHRISTELLA Cedar, FNP [facility] [phone]

## 2024-08-22 ENCOUNTER — Ambulatory Visit: Payer: Self-pay | Admitting: Gastroenterology

## 2024-08-22 LAB — LIPID PANEL
Chol/HDL Ratio: 3.4 ratio (ref 0.0–4.4)
Cholesterol, Total: 162 mg/dL (ref 100–199)
HDL: 47 mg/dL (ref >39)
LDL Chol Calc (NIH): 104 mg/dL — ABNORMAL HIGH (ref 0–99)
Triglycerides: 53 mg/dL (ref 0–149)
VLDL Cholesterol Cal: 11 mg/dL (ref 5–40)

## 2024-08-22 LAB — FIB-4 W/RX NASH FIBROSURE PLUS
ALT: 21 IU/L (ref 0–32)
AST: 17 IU/L (ref 0–40)
FIB-4 Index: 0.33 (ref 0.00–2.67)
Platelets: 377 x10E3/uL (ref 150–450)

## 2024-08-22 LAB — COMPREHENSIVE METABOLIC PANEL WITH GFR
ALT: 18 IU/L (ref 0–32)
AST: 18 IU/L (ref 0–40)
Albumin: 4.3 g/dL (ref 3.9–4.9)
Alkaline Phosphatase: 96 IU/L (ref 44–121)
BUN/Creatinine Ratio: 22 (ref 9–23)
BUN: 14 mg/dL (ref 6–20)
Bilirubin Total: 0.5 mg/dL (ref 0.0–1.2)
CO2: 22 mmol/L (ref 20–29)
Calcium: 9.4 mg/dL (ref 8.7–10.2)
Chloride: 100 mmol/L (ref 96–106)
Creatinine, Ser: 0.65 mg/dL (ref 0.57–1.00)
Globulin, Total: 3.1 g/dL (ref 1.5–4.5)
Glucose: 81 mg/dL (ref 70–99)
Potassium: 4.8 mmol/L (ref 3.5–5.2)
Sodium: 139 mmol/L (ref 134–144)
Total Protein: 7.4 g/dL (ref 6.0–8.5)
eGFR: 118 mL/min/1.73 (ref >59)

## 2024-08-22 LAB — VITAMIN D 25 HYDROXY (VIT D DEFICIENCY, FRACTURES): Vit D, 25-Hydroxy: 33.9 ng/mL (ref 30.0–100.0)

## 2024-08-23 ENCOUNTER — Ambulatory Visit: Payer: Self-pay | Admitting: Family Medicine

## 2024-08-24 ENCOUNTER — Other Ambulatory Visit: Payer: Self-pay | Admitting: Family Medicine

## 2024-08-24 DIAGNOSIS — I10 Essential (primary) hypertension: Secondary | ICD-10-CM

## 2024-08-24 DIAGNOSIS — E559 Vitamin D deficiency, unspecified: Secondary | ICD-10-CM

## 2024-09-10 ENCOUNTER — Other Ambulatory Visit: Payer: Self-pay | Admitting: Family Medicine

## 2024-09-10 DIAGNOSIS — K7581 Nonalcoholic steatohepatitis (NASH): Secondary | ICD-10-CM

## 2024-09-10 MED ORDER — WEGOVY 0.25 MG/0.5ML ~~LOC~~ SOAJ: 0.2500 mg | SUBCUTANEOUS | 0 refills | Status: DC

## 2024-10-02 ENCOUNTER — Telehealth: Admitting: Family Medicine

## 2024-10-02 DIAGNOSIS — Z20818 Contact with and (suspected) exposure to other bacterial communicable diseases: Secondary | ICD-10-CM | POA: Diagnosis not present

## 2024-10-02 DIAGNOSIS — J029 Acute pharyngitis, unspecified: Secondary | ICD-10-CM | POA: Diagnosis not present

## 2024-10-02 MED ORDER — AZITHROMYCIN 250 MG PO TABS: ORAL_TABLET | ORAL | 0 refills | Status: DC

## 2024-10-02 NOTE — Progress Notes (Signed)
 Virtual Visit via Telephone Note   This visit type was conducted secondary to patient preference or provider felt to be most appropriate for this patient at this time.  All issues noted in this document were discussed and addressed.  No physical exam could be performed with this format.  Patient verbally consented to a telehealth visit.   Date:  10/02/2024   ID:  Kellie Foster, DOB 1991/06/17, MRN [id number]  Patient Location: Other:  work Provider Location: Office/Clinic  PCP:  Teressa Harrie HERO, FNP   History of Present Illness:    Sore Throat  This is a new problem. The current episode started today. The problem has been gradually worsening. There has been no fever. The pain is moderate. Pertinent negatives include no congestion, coughing, diarrhea, ear pain, headaches, shortness of breath or vomiting. She has had exposure to strep. She has tried cool liquids for the symptoms. The treatment provided mild relief.    Chief Complaint  Patient presents with   Sore Throat    Past Medical History:  Diagnosis Date   Allergy    Hypertension    Obesity    Recurrent UTI (urinary tract infection)     History reviewed. No pertinent surgical history.  Family History  Problem Relation Age of Onset   Hypertension Mother    Diabetes Mother    Kidney cancer Mother    Cancer Mother    Hypertension Father     Social History   Socioeconomic History   Marital status: Single    Spouse name: Not on file   Number of children: Not on file   Years of education: Not on file   Highest education level: GED or equivalent  Occupational History   Not on file  Tobacco Use   Smoking status: Never   Smokeless tobacco: Never   Tobacco comments:    2nd hand smoking exposure  Substance and Sexual Activity   Alcohol use: No   Drug use: No   Sexual activity: Not on file  Other Topics Concern   Not on file  Social History Narrative   Not on file   Social Drivers of Health   Financial  Resource Strain: Low Risk  (10/02/2024)   Overall Financial Resource Strain (CARDIA)    Difficulty of Paying Living Expenses: Not hard at all  Food Insecurity: No Food Insecurity (10/02/2024)   Hunger Vital Sign    Worried About Running Out of Food in the Last Year: Never true    Ran Out of Food in the Last Year: Never true  Transportation Needs: No Transportation Needs (10/02/2024)   PRAPARE - Administrator, Civil Service (Medical): No    Lack of Transportation (Non-Medical): No  Physical Activity: Insufficiently Active (10/02/2024)   Exercise Vital Sign    Days of Exercise per Week: 3 days    Minutes of Exercise per Session: 30 min  Stress: No Stress Concern Present (10/02/2024)   [employer] of Occupational Health - Occupational Stress Questionnaire    Feeling of Stress: Not at all  Social Connections: Moderately Isolated (10/02/2024)   Social Connection and Isolation Panel    Frequency of Communication with Friends and Family: More than three times a week    Frequency of Social Gatherings with Friends and Family: Once a week    Attends Religious Services: Never    Database administrator or Organizations: No    Attends Banker Meetings: Not on file  Marital Status: Living with partner  Intimate Partner Violence: Not At Risk (06/20/2024)   Humiliation, Afraid, Rape, and Kick questionnaire    Fear of Current or Ex-Partner: No    Emotionally Abused: No    Physically Abused: No    Sexually Abused: No    Outpatient Medications Prior to Visit  Medication Sig Dispense Refill   cetirizine  (ZYRTEC  ALLERGY) 10 MG tablet Take 1 tablet (10 mg total) by mouth daily.     fluticasone  (FLONASE ) 50 MCG/ACT nasal spray Place 2 sprays into both nostrils daily. 15.8 mL 3   meloxicam  (MOBIC ) 15 MG tablet Take 1 tablet (15 mg total) by mouth daily. 30 tablet 0   metFORMIN  (GLUCOPHAGE ) 500 MG tablet Take 1 tablet (500 mg total) by mouth daily with breakfast. 90  tablet 1   metoprolol  tartrate (LOPRESSOR ) 50 MG tablet Take 1 tablet by mouth twice daily 180 tablet 0   montelukast  (SINGULAIR ) 10 MG tablet Take 1 tablet (10 mg total) by mouth at bedtime. 30 tablet 3   Norgestimate-Ethinyl Estradiol Triphasic (TRI-SPRINTEC) 0.18/0.215/0.25 MG-35 MCG tablet Take 1 tablet by mouth daily. 28 tablet 5   Rimegepant Sulfate (NURTEC) 75 MG TBDP Take 1 tablet (75 mg total) by mouth daily as needed.     Semaglutide ,0.25 or 0.5MG /DOS, (OZEMPIC , 0.25 OR 0.5 MG/DOSE,) 2 MG/3ML SOPN Inject 0.25 mg into the skin once a week. 3 mL 0   semaglutide -weight management (WEGOVY ) 0.25 MG/0.5ML SOAJ SQ injection Inject 0.25 mg into the skin once a week. 2 mL 0   spironolactone  (ALDACTONE ) 25 MG tablet Take 1 tablet (25 mg total) by mouth daily. 90 tablet 3   Vitamin D , Ergocalciferol , (DRISDOL ) 1.25 MG (50000 UNIT) CAPS capsule TAKE 1 CAPSULE BY MOUTH EVERY 7 DAYS FOR  12  WEEKS 12 capsule 0   No facility-administered medications prior to visit.    Allergies:   Ciprofloxacin  hcl, Penicillins, and Sulfa antibiotics   Social History   Tobacco Use   Smoking status: Never   Smokeless tobacco: Never   Tobacco comments:    2nd hand smoking exposure  Substance Use Topics   Alcohol use: No   Drug use: No     Review of Systems  Constitutional:  Negative for chills, fever and malaise/fatigue.  HENT:  Positive for sore throat. Negative for congestion, ear pain and sinus pain.   Eyes: Negative.   Respiratory:  Negative for cough, shortness of breath and wheezing.   Cardiovascular:  Negative for chest pain, palpitations and leg swelling.  Gastrointestinal:  Negative for constipation, diarrhea, nausea and vomiting.  Genitourinary:  Negative for dysuria, frequency and urgency.  Musculoskeletal: Negative.   Skin: Negative.   Neurological:  Negative for dizziness and headaches.  Endo/Heme/Allergies: Negative.   Psychiatric/Behavioral: Negative.       Labs/Other Tests and Data  Reviewed:    Recent Labs: 10/24/2023: TSH 4.110 05/23/2024: Hemoglobin 15.4 08/21/2024: ALT 18; BUN 14; Creatinine, Ser 0.65; Platelets 377; Potassium 4.8; Sodium 139   Recent Lipid Panel Lab Results  Component Value Date/Time   CHOL 162 08/21/2024 04:04 PM   TRIG 53 08/21/2024 04:04 PM   HDL 47 08/21/2024 04:04 PM   CHOLHDL 3.4 08/21/2024 04:04 PM   LDLCALC 104 (H) 08/21/2024 04:04 PM    Wt Readings from Last 3 Encounters:  08/21/24 (!) 311 lb (141.1 kg)  08/19/24 (!) 314 lb (142.4 kg)  06/20/24 (!) 307 lb (139.3 kg)     Objective:    Vital Signs:  There were no vitals taken for this visit.   Physical Exam Pulmonary:     Effort: Pulmonary effort is normal.  Neurological:     Mental Status: She is alert and oriented to person, place, and time.    Due to the nature of the visit unable to perform complete physical examination.   ASSESSMENT & PLAN:    Pharyngitis Exposure to strep throat - fiance seen earlier today - Try honey and lemon for sore throat - Gargle with warm salt water - Try hot tea to soothe the throat  - Azithromycin  250 mg  by mouth, 2 tablets on day #1 and 1 tablet on days #2- #4    Meds ordered this encounter  Medications   azithromycin  (ZITHROMAX ) 250 MG tablet    Sig: 2 DAILY FOR FIRST DAY, THEN DECREASE TO ONE DAILY FOR 4 MORE DAYS.    Dispense:  6 tablet    Refill:  0    I spent 8 minutes dedicated to the care of this patient on the date of this encounter with the patient.  Follow Up:  In Person prn  Signed,  Harrie Cedar, FNP [facility] [phone]

## 2024-10-02 NOTE — Assessment & Plan Note (Signed)
 Exposure to strep throat - fiance seen earlier today - Try honey and lemon for sore throat - Gargle with warm salt water - Try hot tea to soothe the throat  - Azithromycin  250 mg  by mouth, 2 tablets on day #1 and 1 tablet on days #2- #4

## 2024-10-02 NOTE — Progress Notes (Deleted)
 Acute Office Visit  Subjective:    Patient ID: Kellie Foster, female    DOB: 09-08-1991, 33 y.o.   MRN: [id number]  Chief Complaint  Patient presents with   Sore Throat    HPI: Patient is in today for sore throat  Discussed the use of AI scribe software for clinical note transcription with the patient, who gave verbal consent to proceed.      Past Medical History:  Diagnosis Date   Allergy    Hypertension    Obesity    Recurrent UTI (urinary tract infection)     History reviewed. No pertinent surgical history.  Family History  Problem Relation Age of Onset   Hypertension Mother    Diabetes Mother    Kidney cancer Mother    Cancer Mother    Hypertension Father     Social History   Socioeconomic History   Marital status: Single    Spouse name: Not on file   Number of children: Not on file   Years of education: Not on file   Highest education level: GED or equivalent  Occupational History   Not on file  Tobacco Use   Smoking status: Never   Smokeless tobacco: Never   Tobacco comments:    2nd hand smoking exposure  Substance and Sexual Activity   Alcohol use: No   Drug use: No   Sexual activity: Not on file  Other Topics Concern   Not on file  Social History Narrative   Not on file   Social Drivers of Health   Financial Resource Strain: Low Risk  (10/02/2024)   Overall Financial Resource Strain (CARDIA)    Difficulty of Paying Living Expenses: Not hard at all  Food Insecurity: No Food Insecurity (10/02/2024)   Hunger Vital Sign    Worried About Running Out of Food in the Last Year: Never true    Ran Out of Food in the Last Year: Never true  Transportation Needs: No Transportation Needs (10/02/2024)   PRAPARE - Administrator, Civil Service (Medical): No    Lack of Transportation (Non-Medical): No  Physical Activity: Insufficiently Active (10/02/2024)   Exercise Vital Sign    Days of Exercise per Week: 3 days    Minutes of Exercise  per Session: 30 min  Stress: No Stress Concern Present (10/02/2024)   [employer] of Occupational Health - Occupational Stress Questionnaire    Feeling of Stress: Not at all  Social Connections: Moderately Isolated (10/02/2024)   Social Connection and Isolation Panel    Frequency of Communication with Friends and Family: More than three times a week    Frequency of Social Gatherings with Friends and Family: Once a week    Attends Religious Services: Never    Database administrator or Organizations: No    Attends Engineer, structural: Not on file    Marital Status: Living with partner  Intimate Partner Violence: Not At Risk (06/20/2024)   Humiliation, Afraid, Rape, and Kick questionnaire    Fear of Current or Ex-Partner: No    Emotionally Abused: No    Physically Abused: No    Sexually Abused: No    Outpatient Medications Prior to Visit  Medication Sig Dispense Refill   cetirizine  (ZYRTEC  ALLERGY) 10 MG tablet Take 1 tablet (10 mg total) by mouth daily.     fluticasone  (FLONASE ) 50 MCG/ACT nasal spray Place 2 sprays into both nostrils daily. 15.8 mL 3   meloxicam  (MOBIC ) 15 MG  tablet Take 1 tablet (15 mg total) by mouth daily. 30 tablet 0   metFORMIN  (GLUCOPHAGE ) 500 MG tablet Take 1 tablet (500 mg total) by mouth daily with breakfast. 90 tablet 1   metoprolol  tartrate (LOPRESSOR ) 50 MG tablet Take 1 tablet by mouth twice daily 180 tablet 0   montelukast  (SINGULAIR ) 10 MG tablet Take 1 tablet (10 mg total) by mouth at bedtime. 30 tablet 3   Norgestimate-Ethinyl Estradiol Triphasic (TRI-SPRINTEC) 0.18/0.215/0.25 MG-35 MCG tablet Take 1 tablet by mouth daily. 28 tablet 5   Rimegepant Sulfate (NURTEC) 75 MG TBDP Take 1 tablet (75 mg total) by mouth daily as needed.     Semaglutide ,0.25 or 0.5MG /DOS, (OZEMPIC , 0.25 OR 0.5 MG/DOSE,) 2 MG/3ML SOPN Inject 0.25 mg into the skin once a week. 3 mL 0   semaglutide -weight management (WEGOVY ) 0.25 MG/0.5ML SOAJ SQ injection Inject 0.25  mg into the skin once a week. 2 mL 0   spironolactone  (ALDACTONE ) 25 MG tablet Take 1 tablet (25 mg total) by mouth daily. 90 tablet 3   Vitamin D , Ergocalciferol , (DRISDOL ) 1.25 MG (50000 UNIT) CAPS capsule TAKE 1 CAPSULE BY MOUTH EVERY 7 DAYS FOR  12  WEEKS 12 capsule 0   No facility-administered medications prior to visit.    Allergies  Allergen Reactions   Ciprofloxacin  Hcl Hives   Penicillins Hives    Has patient had a PCN reaction causing immediate rash, facial/tongue/throat swelling, SOB or lightheadedness with hypotension:YES  Has patient had a PCN reaction causing severe rash involving mucus membranes or skin necrosis:No  Has patient had a PCN reaction that required hospitalization NO  Has patient had a PCN reaction occurring within the last 10 years: NO  If all of the above answers are NO, then may proceed with Cephalosporin use.   Sulfa Antibiotics Rash    Review of Systems  Constitutional:  Negative for chills, diaphoresis, fatigue and fever.  HENT:  Positive for sore throat. Negative for congestion, ear pain and sinus pain.   Eyes: Negative.   Respiratory:  Negative for cough and shortness of breath.   Cardiovascular:  Negative for chest pain.  Gastrointestinal:  Negative for abdominal pain, constipation, nausea and vomiting.  Endocrine: Negative.   Genitourinary:  Negative for dysuria.  Musculoskeletal:  Negative for arthralgias.  Neurological:  Negative for weakness and headaches.  Psychiatric/Behavioral:  Negative for dysphoric mood. The patient is not nervous/anxious.        Objective:        08/21/2024    3:31 PM 08/19/2024    8:44 AM 06/20/2024    7:54 AM  Vitals with BMI  Height 5' 4 5' 4 5' 4  Weight 311 lbs 314 lbs 307 lbs  BMI 53.36 53.87 52.67  Systolic 122 110 877  Diastolic 74 72 80  Pulse 66 86 85    No data found.   Physical Exam  Health Maintenance Due  Topic Date Due   HPV VACCINES (1 - 3-dose SCDM series) Never done    Influenza Vaccine  07/12/2024       Topic Date Due   HPV VACCINES (1 - 3-dose SCDM series) Never done     Lab Results  Component Value Date   TSH 4.110 10/24/2023   Lab Results  Component Value Date   WBC 9.2 05/23/2024   HGB 15.4 05/23/2024   HCT 49.5 (H) 05/23/2024   MCV 92 05/23/2024   PLT 377 08/21/2024   Lab Results  Component Value Date  NA 139 08/21/2024   K 4.8 08/21/2024   CO2 22 08/21/2024   GLUCOSE 81 08/21/2024   BUN 14 08/21/2024   CREATININE 0.65 08/21/2024   BILITOT 0.5 08/21/2024   ALKPHOS 96 08/21/2024   AST 18 08/21/2024   ALT 18 08/21/2024   PROT 7.4 08/21/2024   ALBUMIN 4.3 08/21/2024   CALCIUM 9.4 08/21/2024   ANIONGAP 13 01/06/2016   EGFR 118 08/21/2024   Lab Results  Component Value Date   CHOL 162 08/21/2024   Lab Results  Component Value Date   HDL 47 08/21/2024   Lab Results  Component Value Date   LDLCALC 104 (H) 08/21/2024   Lab Results  Component Value Date   TRIG 53 08/21/2024   Lab Results  Component Value Date   CHOLHDL 3.4 08/21/2024   Lab Results  Component Value Date   HGBA1C 5.6 10/18/2023        Results for orders placed or performed in visit on 08/21/24  POCT rapid strep A   Collection Time: 08/21/24  3:37 PM  Result Value Ref Range   Rapid Strep A Screen Positive (A) Negative  FIB-4 w/Rx NASH FibroSure Plus   Collection Time: 08/21/24  4:03 PM  Result Value Ref Range   AST 17 0 - 40 IU/L   ALT 21 0 - 32 IU/L   Platelets 377 150 - 450 x10E3/uL   FIB-4 Index 0.33 0.00 - 2.67  Comprehensive metabolic panel with GFR   Collection Time: 08/21/24  4:04 PM  Result Value Ref Range   Glucose 81 70 - 99 mg/dL   BUN 14 6 - 20 mg/dL   Creatinine, Ser 9.34 0.57 - 1.00 mg/dL   eGFR 881 >40 fO/fpw/8.26   BUN/Creatinine Ratio 22 9 - 23   Sodium 139 134 - 144 mmol/L   Potassium 4.8 3.5 - 5.2 mmol/L   Chloride 100 96 - 106 mmol/L   CO2 22 20 - 29 mmol/L   Calcium 9.4 8.7 - 10.2 mg/dL   Total Protein 7.4  6.0 - 8.5 g/dL   Albumin 4.3 3.9 - 4.9 g/dL   Globulin, Total 3.1 1.5 - 4.5 g/dL   Bilirubin Total 0.5 0.0 - 1.2 mg/dL   Alkaline Phosphatase 96 44 - 121 IU/L   AST 18 0 - 40 IU/L   ALT 18 0 - 32 IU/L  Vitamin D , 25-hydroxy   Collection Time: 08/21/24  4:04 PM  Result Value Ref Range   Vit D, 25-Hydroxy 33.9 30.0 - 100.0 ng/mL  Lipid Panel   Collection Time: 08/21/24  4:04 PM  Result Value Ref Range   Cholesterol, Total 162 100 - 199 mg/dL   Triglycerides 53 0 - 149 mg/dL   HDL 47 >60 mg/dL   VLDL Cholesterol Cal 11 5 - 40 mg/dL   LDL Chol Calc (NIH) 895 (H) 0 - 99 mg/dL   Chol/HDL Ratio 3.4 0.0 - 4.4 ratio  POC COVID-19 BinaxNow   Collection Time: 08/21/24  4:25 PM  Result Value Ref Range   SARS Coronavirus 2 Ag Negative Negative     Assessment & Plan:   Assessment & Plan     There is no height or weight on file to calculate BMI.SABRA  Assessment and Plan      No orders of the defined types were placed in this encounter.   No orders of the defined types were placed in this encounter.    Follow-up: No follow-ups on file.  An After Visit  Summary was printed and given to the patient.  Harrie CHRISTELLA Cedar, FNP [facility] [phone]

## 2024-11-23 ENCOUNTER — Other Ambulatory Visit: Payer: Self-pay

## 2024-11-27 DIAGNOSIS — E559 Vitamin D deficiency, unspecified: Secondary | ICD-10-CM

## 2024-11-27 MED ORDER — VITAMIN D (ERGOCALCIFEROL) 1.25 MG (50000 UNIT) PO CAPS: 50000.0000 [IU] | ORAL_CAPSULE | ORAL | 0 refills | Status: AC

## 2024-12-25 ENCOUNTER — Other Ambulatory Visit: Payer: Self-pay | Admitting: Family Medicine

## 2024-12-25 DIAGNOSIS — E282 Polycystic ovarian syndrome: Secondary | ICD-10-CM

## 2024-12-25 DIAGNOSIS — I10 Essential (primary) hypertension: Secondary | ICD-10-CM

## 2025-01-02 ENCOUNTER — Other Ambulatory Visit: Payer: Self-pay | Admitting: Family Medicine

## 2025-01-02 DIAGNOSIS — E282 Polycystic ovarian syndrome: Secondary | ICD-10-CM

## 2025-01-07 ENCOUNTER — Ambulatory Visit: Admitting: Family Medicine

## 2025-01-07 ENCOUNTER — Encounter: Payer: Self-pay | Admitting: Family Medicine

## 2025-01-07 VITALS — BP 134/82 | HR 118 | Temp 98.0°F | Ht 64.0 in | Wt 326.0 lb

## 2025-01-07 DIAGNOSIS — K7581 Nonalcoholic steatohepatitis (NASH): Secondary | ICD-10-CM

## 2025-01-07 DIAGNOSIS — E282 Polycystic ovarian syndrome: Secondary | ICD-10-CM

## 2025-01-07 DIAGNOSIS — Z0000 Encounter for general adult medical examination without abnormal findings: Secondary | ICD-10-CM

## 2025-01-07 DIAGNOSIS — Z6841 Body Mass Index (BMI) 40.0 and over, adult: Secondary | ICD-10-CM | POA: Diagnosis not present

## 2025-01-07 MED ORDER — ZEPBOUND 2.5 MG/0.5ML ~~LOC~~ SOAJ: 2.5000 mg | SUBCUTANEOUS | 0 refills | Status: AC

## 2025-01-07 MED ORDER — METFORMIN HCL 500 MG PO TABS: 500.0000 mg | ORAL_TABLET | Freq: Every day | ORAL | 0 refills | Status: AC

## 2025-01-07 MED ORDER — SPIRONOLACTONE 25 MG PO TABS: 25.0000 mg | ORAL_TABLET | Freq: Every day | ORAL | 0 refills | Status: AC

## 2025-01-07 NOTE — Assessment & Plan Note (Signed)
 PCOS managed with metformin .  - Refill metformin  prescription at [facility] pharmacy. Orders:   metFORMIN  (GLUCOPHAGE ) 500 MG tablet; Take 1 tablet (500 mg total) by mouth daily with breakfast.   spironolactone  (ALDACTONE ) 25 MG tablet; Take 1 tablet (25 mg total) by mouth daily.

## 2025-01-07 NOTE — Assessment & Plan Note (Signed)
 Body mass index is 55.96 kg/m, not at goal Actively working on weight loss with increased physical activity and dietary modifications. Discussed potential side effects and insurance coverage for Wegovy  or Zepbound . Discussed Zepbound  may affect oral hormone contraception efficacy. - Prescribed Zepbound  for weight loss. - Will check new insurance coverage for Zepbound . - Follow up in 4 weeks to assess progress and adjust dosage if needed. - Aim to do some physical activity for 150 minutes per week. This is typically divided into 5 days per week, 30 minutes per day. The activity should be enough to get your heart rate up. Anything is better than nothing if you have time constraints.   Orders:   tirzepatide  (ZEPBOUND ) 2.5 MG/0.5ML Pen; Inject 2.5 mg into the skin once a week.

## 2025-01-07 NOTE — Progress Notes (Signed)
 "  Subjective:  Patient ID: Kellie Foster, female    DOB: [birth year]  Age: 34 y.o. MRN: [id number]  Chief Complaint  Patient presents with   Annual Exam    Well Adult Physical: Patient here for a comprehensive physical exam.The patient reports no problems Do you take any herbs or supplements that were not prescribed by a doctor? no Are you taking calcium supplements? no Are you taking aspirin daily? no  Encounter for general adult medical examination without abnormal findings  Physical (At Risk items are starred): Patient's last physical exam was 1 year ago .  Patient is not afflicted from Stress Incontinence and Urge Incontinence  Patient wears a seat belts Patient has smoke detectors and has carbon monoxide detectors. Patient practices appropriate gun safety. Patient wears sunscreen with extended sun exposure. Dental Care: biannual cleanings, brushes and flosses daily. Ophthalmology/Optometry: Annual visit.  Hearing loss: none Vision impairments: Glasses  Menarche: 34 or 34 y.o Menstrual History: Regular since starting Metformin  LMP: 01/03/2025 Pregnancy history: None Safe at home: Yes Self breast exams: Yes  Discussed the use of AI scribe software for clinical note transcription with the patient, who gave verbal consent to proceed.  History of Present Illness   Kellie Foster is a 34 year old female with PCOS and fatty liver who presents for medication refills and weight management.  Polycystic ovary syndrome (pcos) - Currently taking metformin  and Aldactone  for management - Requires refills for metformin  and Aldactone  - Uses Sprintec for birth control, with seven packets remaining  Fatty liver disease - Avoids processed foods to minimize hepatic steatosis - Meal preps with lean proteins such as chicken - Managed by specialist  Weight management - Interested in pharmacologic options including Wegovy  and Zepbound  - Increasing physical activity, aiming for 10,000 steps  daily - Walks dog regularly and maintains activity despite weather challenges - Husband's employment encourages physical fitness - No new symptoms related to weight management  Hyperlipidemia - Recent laboratory evaluation revealed mildly elevated cholesterol  Vitamin d  deficiency - Recent laboratory evaluation revealed low vitamin D  - Currently taking vitamin D  supplementation  Allergic rhinitis - Currently taking Zyrtec  over the counter - Uses Flonase  and Singulair  seasonally for allergy management - No current problems with allergies  Migraine headaches - Uses Nurtec as needed for migraine management - No current problems with migraines - Does not require Nurtec refill at this time          01/07/2025   10:12 AM 05/23/2024    7:40 AM 01/31/2024   10:30 AM 01/24/2024    8:58 AM 10/17/2023    2:08 PM  Depression screen PHQ 2/9  Decreased Interest 0 0 0 0 0  Down, Depressed, Hopeless 0 0 0 0 0  PHQ - 2 Score 0 0 0 0 0  Altered sleeping 0 0  0 1  Tired, decreased energy 0 0  0 1  Change in appetite 0 0  0 1  Feeling bad or failure about yourself  0 0  0 0  Trouble concentrating 0 0  0 0  Moving slowly or fidgety/restless 0 0  0 0  Suicidal thoughts 0 0  0 0  PHQ-9 Score 0 0   0  3   Difficult doing work/chores Not difficult at all Not difficult at all  Not difficult at all Not difficult at all     Data saved with a previous flowsheet row definition         05/31/2023  1:24 PM 10/17/2023    2:09 PM 01/24/2024    8:58 AM 01/31/2024   10:30 AM 05/23/2024    7:40 AM  Fall Risk  Falls in the past year? 0 0 0 0 0  Was there an injury with Fall? 0  0  0  0  0   Fall Risk Category Calculator 0 0 0 0 0  Patient at Risk for Falls Due to No Fall Risks No Fall Risks No Fall Risks No Fall Risks No Fall Risks  Fall risk Follow up Falls evaluation completed  Falls evaluation completed  Falls evaluation completed     Data saved with a previous flowsheet row definition              Social Hx   Social History   Socioeconomic History   Marital status: Single    Spouse name: Not on file   Number of children: Not on file   Years of education: Not on file   Highest education level: GED or equivalent  Occupational History   Not on file  Tobacco Use   Smoking status: Never   Smokeless tobacco: Never   Tobacco comments:    2nd hand smoking exposure  Substance and Sexual Activity   Alcohol use: No   Drug use: No   Sexual activity: Not on file  Other Topics Concern   Not on file  Social History Narrative   Not on file   Social Drivers of Health   Tobacco Use: Low Risk (01/07/2025)   Patient History    Smoking Tobacco Use: Never    Smokeless Tobacco Use: Never    Passive Exposure: Not on file  Financial Resource Strain: Low Risk (01/07/2025)   Overall Financial Resource Strain (CARDIA)    Difficulty of Paying Living Expenses: Not hard at all  Food Insecurity: No Food Insecurity (01/07/2025)   Epic    Worried About Radiation Protection Practitioner of Food in the Last Year: Never true    Ran Out of Food in the Last Year: Never true  Transportation Needs: No Transportation Needs (01/07/2025)   Epic    Lack of Transportation (Medical): No    Lack of Transportation (Non-Medical): No  Physical Activity: Insufficiently Active (01/07/2025)   Exercise Vital Sign    Days of Exercise per Week: 3 days    Minutes of Exercise per Session: 30 min  Stress: No Stress Concern Present (01/07/2025)   [employer] of Occupational Health - Occupational Stress Questionnaire    Feeling of Stress: Not at all  Social Connections: Moderately Isolated (01/07/2025)   Social Connection and Isolation Panel    Frequency of Communication with Friends and Family: More than three times a week    Frequency of Social Gatherings with Friends and Family: Once a week    Attends Religious Services: Never    Database Administrator or Organizations: No    Attends Banker Meetings: Never     Marital Status: Living with partner  Depression (PHQ2-9): Low Risk (01/07/2025)   Depression (PHQ2-9)    PHQ-2 Score: 0  Alcohol Screen: Low Risk (01/07/2025)   Alcohol Screen    Last Alcohol Screening Score (AUDIT): 0  Housing: Low Risk (01/07/2025)   Epic    Unable to Pay for Housing in the Last Year: No    Number of Times Moved in the Last Year: 0    Homeless in the Last Year: No  Utilities: Not At Risk (01/07/2025)   Epic  Threatened with loss of utilities: No  Health Literacy: Adequate Health Literacy (01/07/2025)   B1300 Health Literacy    Frequency of need for help with medical instructions: Never   Past Medical History:  Diagnosis Date   Allergy    Hypertension    Obesity    Recurrent UTI (urinary tract infection)    History reviewed. No pertinent surgical history.  Family History  Problem Relation Age of Onset   Hypertension Mother    Diabetes Mother    Kidney cancer Mother    Cancer Mother    Hypertension Father     Review of Systems  Constitutional:  Negative for chills, diaphoresis, fatigue and fever.  HENT:  Negative for congestion, ear pain and sinus pain.   Respiratory:  Negative for cough and shortness of breath.   Cardiovascular:  Negative for chest pain.  Gastrointestinal:  Negative for abdominal pain, constipation, nausea and vomiting.  Genitourinary:  Negative for dysuria.  Musculoskeletal:  Negative for arthralgias.  Neurological:  Negative for weakness and headaches.  Psychiatric/Behavioral:  Negative for dysphoric mood. The patient is not nervous/anxious.      Objective:  BP 134/82   Pulse (!) 118   Temp 98 F (36.7 C)   Ht 5' 4 (1.626 m)   Wt (!) 326 lb (147.9 kg)   LMP 01/03/2025   SpO2 98%   BMI 55.96 kg/m      01/07/2025   10:08 AM 08/21/2024    3:31 PM 08/19/2024    8:44 AM  BP/Weight  Systolic BP 134 122 110  Diastolic BP 82 74 72  Wt. (Lbs) 326 311 314  BMI 55.96 kg/m2 53.38 kg/m2 53.9 kg/m2    Physical Exam Vitals  reviewed.  Constitutional:      General: She is not in acute distress.    Appearance: Normal appearance. She is well-groomed. She is obese. She is not ill-appearing.  HENT:     Head: Normocephalic.     Right Ear: Tympanic membrane, ear canal and external ear normal. No tenderness.     Left Ear: Ear canal and external ear normal. No tenderness. A middle ear effusion is present.     Nose: Nose normal. No congestion or rhinorrhea.     Mouth/Throat:     Mouth: Mucous membranes are moist.     Pharynx: Oropharynx is clear. No posterior oropharyngeal erythema.  Eyes:     Extraocular Movements: Extraocular movements intact.     Conjunctiva/sclera: Conjunctivae normal.     Pupils: Pupils are equal, round, and reactive to light.  Cardiovascular:     Rate and Rhythm: Normal rate and regular rhythm.     Pulses: Normal pulses.     Heart sounds: Normal heart sounds. No murmur heard. Pulmonary:     Effort: Pulmonary effort is normal. No respiratory distress.     Breath sounds: Normal breath sounds. No wheezing.  Abdominal:     General: Bowel sounds are normal. There is no distension.     Palpations: Abdomen is soft.     Tenderness: There is no abdominal tenderness.  Musculoskeletal:        General: Normal range of motion.     Cervical back: Normal range of motion and neck supple.  Lymphadenopathy:     Cervical: No cervical adenopathy.  Skin:    General: Skin is warm and dry.  Neurological:     General: No focal deficit present.     Mental Status: She is alert and oriented to  person, place, and time. Mental status is at baseline.     Cranial Nerves: Cranial nerves 2-12 are intact.     Sensory: Sensation is intact.     Motor: Motor function is intact.     Coordination: Coordination is intact.     Gait: Gait is intact.     Deep Tendon Reflexes: Reflexes are normal and symmetric.  Psychiatric:        Attention and Perception: Attention and perception normal.        Mood and Affect: Mood  normal.        Speech: Speech normal.        Behavior: Behavior normal.        Thought Content: Thought content normal.        Judgment: Judgment normal.     Lab Results  Component Value Date   WBC 9.2 05/23/2024   HGB 15.4 05/23/2024   HCT 49.5 (H) 05/23/2024   PLT 377 08/21/2024   GLUCOSE 81 08/21/2024   CHOL 162 08/21/2024   TRIG 53 08/21/2024   HDL 47 08/21/2024   LDLCALC 104 (H) 08/21/2024   ALT 18 08/21/2024   AST 18 08/21/2024   NA 139 08/21/2024   K 4.8 08/21/2024   CL 100 08/21/2024   CREATININE 0.65 08/21/2024   BUN 14 08/21/2024   CO2 22 08/21/2024   TSH 4.110 10/24/2023   INR 0.9 08/19/2024   HGBA1C 5.6 10/18/2023      Assessment & Plan:   Assessment & Plan Annual physical exam General Health Maintenance Routine health maintenance discussed. Up to date on vaccines and screenings  Things to do to keep yourself healthy  - Exercise at least 30-45 minutes a day, 3-4 days a week.  - Eat a low-fat diet with lots of fruits and vegetables, up to 7-9 servings per day.  - Seatbelts can save your life. Wear them always.  - Smoke detectors on every level of your home, check batteries every year.  - Eye Doctor - have an eye exam every 1-2 years   - Alcohol -  If you drink, do it moderately, less than 2 drinks per day.  - Health Care Power of Attorney. Choose someone to speak for you if you are not able.  - Depression is common in our stressful world.If you're feeling down or losing interest in things you normally enjoy, please come in for a visit.  - Violence - If anyone is threatening or hurting you, please call immediately.  Orders:   CBC with Differential   Comprehensive metabolic panel with GFR   TSH   Lipid Panel   Vitamin D , 25-hydroxy  PCOS (polycystic ovarian syndrome) PCOS managed with metformin .  - Refill metformin  prescription at [facility]. Orders:   metFORMIN  (GLUCOPHAGE ) 500 MG tablet; Take 1 tablet (500 mg total) by mouth daily with  breakfast.   spironolactone  (ALDACTONE ) 25 MG tablet; Take 1 tablet (25 mg total) by mouth daily.   Metabolic dysfunction-associated steatohepatitis (MASH) Fatty liver disease, managing with diet and exercise. MASH managed with dietary modifications to avoid processed foods and high-fat meats. Discussed alternatives like chicken sausage and ground chicken. - Liver US  confirmed MASH - GI follow-up not scheduled, will call to to get on schedule Orders:   tirzepatide  (ZEPBOUND ) 2.5 MG/0.5ML Pen; Inject 2.5 mg into the skin once a week.   Morbid obesity with BMI of 50.0-59.9, adult (HCC) Body mass index is 55.96 kg/m, not at goal Actively working on weight loss  with increased physical activity and dietary modifications. Discussed potential side effects and insurance coverage for Wegovy  or Zepbound . Discussed Zepbound  may affect oral hormone contraception efficacy. - Prescribed Zepbound  for weight loss. - Will check new insurance coverage for Zepbound . - Follow up in 4 weeks to assess progress and adjust dosage if needed. - Aim to do some physical activity for 150 minutes per week. This is typically divided into 5 days per week, 30 minutes per day. The activity should be enough to get your heart rate up. Anything is better than nothing if you have time constraints.   Orders:   tirzepatide  (ZEPBOUND ) 2.5 MG/0.5ML Pen; Inject 2.5 mg into the skin once a week.     These are the goals we discussed:  Goals      Activity and Exercise Increased     Evidence-based guidance:  Review current exercise levels.  Assess patient perspective on exercise or activity level, barriers to increasing activity, motivation and readiness for change.  Recommend or set healthy exercise goal based on individual tolerance.  Encourage small steps toward making change in amount of exercise or activity.  Urge reduction of sedentary activities or screen time.  Promote group activities within the community or with family  or support person.  Consider referral to rehabiliation therapist for assessment and exercise/activity plan.   Notes:  Continue to walk every morning     DIET - EAT MORE FRUITS AND VEGETABLES     Encouraged patient to eat more fruits and vegetables      Set My Weight Loss Goal     Follow Up Date 01/07/2025    - set weight loss goal - just wants to lose weight    Why is this important?   Losing only 5 to 15 percent of your weight makes a big difference in your health.    Notes:          This is a list of the screening recommended for you and due dates:  Health Maintenance  Topic Date Due   Flu Shot  03/11/2025*   Pap with HPV screening  10/16/2028   DTaP/Tdap/Td vaccine (8 - Td or Tdap) 08/15/2029   HPV Vaccine (No Doses Required) Completed   HIV Screening  Completed   Pneumococcal Vaccine  Aged Out   Meningitis B Vaccine  Aged Out   COVID-19 Vaccine  Discontinued   Hepatitis C Screening  Discontinued  *Topic was postponed. The date shown is not the original due date.     Meds ordered this encounter  Medications   metFORMIN  (GLUCOPHAGE ) 500 MG tablet    Sig: Take 1 tablet (500 mg total) by mouth daily with breakfast.    Dispense:  30 tablet    Refill:  0   spironolactone  (ALDACTONE ) 25 MG tablet    Sig: Take 1 tablet (25 mg total) by mouth daily.    Dispense:  90 tablet    Refill:  0   tirzepatide  (ZEPBOUND ) 2.5 MG/0.5ML Pen    Sig: Inject 2.5 mg into the skin once a week.    Dispense:  3 mL    Refill:  0    Follow-up: Return in about 4 weeks (around 02/04/2025) for med check - weight loss.  An After Visit Summary was printed and given to the patient.  Harrie Cedar, FNP [facility] [phone]   "

## 2025-01-07 NOTE — Assessment & Plan Note (Signed)
 General Health Maintenance Routine health maintenance discussed. Up to date on vaccines and screenings  Things to do to keep yourself healthy  - Exercise at least 30-45 minutes a day, 3-4 days a week.  - Eat a low-fat diet with lots of fruits and vegetables, up to 7-9 servings per day.  - Seatbelts can save your life. Wear them always.  - Smoke detectors on every level of your home, check batteries every year.  - Eye Doctor - have an eye exam every 1-2 years   - Alcohol -  If you drink, do it moderately, less than 2 drinks per day.  - Health Care Power of Attorney. Choose someone to speak for you if you are not able.  - Depression is common in our stressful world.If you're feeling down or losing interest in things you normally enjoy, please come in for a visit.  - Violence - If anyone is threatening or hurting you, please call immediately.  Orders:   CBC with Differential   Comprehensive metabolic panel with GFR   TSH   Lipid Panel   Vitamin D , 25-hydroxy

## 2025-01-07 NOTE — Assessment & Plan Note (Signed)
 Fatty liver disease, managing with diet and exercise. MASH managed with dietary modifications to avoid processed foods and high-fat meats. Discussed alternatives like chicken sausage and ground chicken. - Liver US  confirmed MASH - GI follow-up not scheduled, will call to to get on schedule Orders:   tirzepatide  (ZEPBOUND ) 2.5 MG/0.5ML Pen; Inject 2.5 mg into the skin once a week.

## 2025-01-08 LAB — CBC WITH DIFFERENTIAL/PLATELET
Basophils Absolute: 0.1 10*3/uL (ref 0.0–0.2)
Basos: 1 %
EOS (ABSOLUTE): 0.3 10*3/uL (ref 0.0–0.4)
Eos: 3 %
Hematocrit: 47.4 % — ABNORMAL HIGH (ref 34.0–46.6)
Hemoglobin: 15.6 g/dL (ref 11.1–15.9)
Immature Grans (Abs): 0 10*3/uL (ref 0.0–0.1)
Immature Granulocytes: 0 %
Lymphocytes Absolute: 2.9 10*3/uL (ref 0.7–3.1)
Lymphs: 27 %
MCH: 29.5 pg (ref 26.6–33.0)
MCHC: 32.9 g/dL (ref 31.5–35.7)
MCV: 90 fL (ref 79–97)
Monocytes Absolute: 0.8 10*3/uL (ref 0.1–0.9)
Monocytes: 7 %
Neutrophils Absolute: 6.8 10*3/uL (ref 1.4–7.0)
Neutrophils: 62 %
Platelets: 381 10*3/uL (ref 150–450)
RBC: 5.29 x10E6/uL — ABNORMAL HIGH (ref 3.77–5.28)
RDW: 11.9 % (ref 11.7–15.4)
WBC: 10.9 10*3/uL — ABNORMAL HIGH (ref 3.4–10.8)

## 2025-01-08 LAB — LIPID PANEL
Chol/HDL Ratio: 3.7 ratio (ref 0.0–4.4)
Cholesterol, Total: 180 mg/dL (ref 100–199)
HDL: 49 mg/dL
LDL Chol Calc (NIH): 119 mg/dL — ABNORMAL HIGH (ref 0–99)
Triglycerides: 61 mg/dL (ref 0–149)
VLDL Cholesterol Cal: 12 mg/dL (ref 5–40)

## 2025-01-08 LAB — COMPREHENSIVE METABOLIC PANEL WITH GFR
ALT: 27 [IU]/L (ref 0–32)
AST: 21 [IU]/L (ref 0–40)
Albumin: 4.3 g/dL (ref 3.9–4.9)
Alkaline Phosphatase: 72 [IU]/L (ref 41–116)
BUN/Creatinine Ratio: 16 (ref 9–23)
BUN: 11 mg/dL (ref 6–20)
Bilirubin Total: 0.6 mg/dL (ref 0.0–1.2)
CO2: 22 mmol/L (ref 20–29)
Calcium: 9.6 mg/dL (ref 8.7–10.2)
Chloride: 103 mmol/L (ref 96–106)
Creatinine, Ser: 0.67 mg/dL (ref 0.57–1.00)
Globulin, Total: 3.4 g/dL (ref 1.5–4.5)
Glucose: 94 mg/dL (ref 70–99)
Potassium: 4.9 mmol/L (ref 3.5–5.2)
Sodium: 139 mmol/L (ref 134–144)
Total Protein: 7.7 g/dL (ref 6.0–8.5)
eGFR: 118 mL/min/{1.73_m2}

## 2025-01-08 LAB — TSH: TSH: 3.19 u[IU]/mL (ref 0.450–4.500)

## 2025-01-08 LAB — VITAMIN D 25 HYDROXY (VIT D DEFICIENCY, FRACTURES): Vit D, 25-Hydroxy: 33.4 ng/mL (ref 30.0–100.0)

## 2025-01-12 ENCOUNTER — Ambulatory Visit: Payer: Self-pay | Admitting: Family Medicine

## 2025-01-12 DIAGNOSIS — D751 Secondary polycythemia: Secondary | ICD-10-CM

## 2025-01-14 ENCOUNTER — Other Ambulatory Visit (HOSPITAL_COMMUNITY): Payer: Self-pay

## 2025-01-17 ENCOUNTER — Other Ambulatory Visit (HOSPITAL_COMMUNITY): Payer: Self-pay

## 2025-02-05 ENCOUNTER — Ambulatory Visit: Admitting: Family Medicine
